# Patient Record
Sex: Female | Born: 1970
Health system: Southern US, Community
[De-identification: ages and names within clinical notes are randomized; demographics above are authoritative.]

## PROBLEM LIST (undated history)

## (undated) DIAGNOSIS — E079 Disorder of thyroid, unspecified: Secondary | ICD-10-CM

## (undated) HISTORY — DX: Disorder of thyroid, unspecified: E07.9

## (undated) HISTORY — PX: APPENDECTOMY: SHX54

## (undated) HISTORY — PX: ABDOMINAL HYSTERECTOMY: SHX81

## (undated) HISTORY — PX: CHOLECYSTECTOMY: SHX55

## (undated) HISTORY — PX: KNEE SURGERY: SHX244

---

## 2016-11-12 ENCOUNTER — Ambulatory Visit (INDEPENDENT_AMBULATORY_CARE_PROVIDER_SITE_OTHER): Payer: Managed Care, Other (non HMO) | Admitting: Emergency Medicine

## 2016-11-12 VITALS — BP 146/86 | HR 67 | Temp 98.0°F | Resp 18 | Ht 66.0 in | Wt 167.0 lb

## 2016-11-12 DIAGNOSIS — N390 Urinary tract infection, site not specified: Secondary | ICD-10-CM | POA: Diagnosis not present

## 2016-11-12 DIAGNOSIS — S39012A Strain of muscle, fascia and tendon of lower back, initial encounter: Secondary | ICD-10-CM | POA: Diagnosis not present

## 2016-11-12 DIAGNOSIS — M62838 Other muscle spasm: Secondary | ICD-10-CM | POA: Diagnosis not present

## 2016-11-12 DIAGNOSIS — M5442 Lumbago with sciatica, left side: Secondary | ICD-10-CM | POA: Insufficient documentation

## 2016-11-12 LAB — COMPREHENSIVE METABOLIC PANEL
A/G RATIO: 2 (ref 1.2–2.2)
ALK PHOS: 51 IU/L (ref 39–117)
ALT: 20 IU/L (ref 0–32)
AST: 13 IU/L (ref 0–40)
Albumin: 4.3 g/dL (ref 3.5–5.5)
BILIRUBIN TOTAL: 0.3 mg/dL (ref 0.0–1.2)
BUN/Creatinine Ratio: 17 (ref 9–23)
BUN: 11 mg/dL (ref 6–24)
CHLORIDE: 105 mmol/L (ref 96–106)
CO2: 23 mmol/L (ref 18–29)
Calcium: 9.3 mg/dL (ref 8.7–10.2)
Creatinine, Ser: 0.65 mg/dL (ref 0.57–1.00)
GFR calc Af Amer: 126 mL/min/{1.73_m2} (ref >59)
GFR calc non Af Amer: 109 mL/min/{1.73_m2} (ref >59)
GLOBULIN, TOTAL: 2.2 g/dL (ref 1.5–4.5)
Glucose: 89 mg/dL (ref 65–99)
POTASSIUM: 4.4 mmol/L (ref 3.5–5.2)
SODIUM: 142 mmol/L (ref 134–144)
Total Protein: 6.5 g/dL (ref 6.0–8.5)

## 2016-11-12 LAB — POCT URINALYSIS DIP (MANUAL ENTRY)
Bilirubin, UA: NEGATIVE
GLUCOSE UA: NEGATIVE
Ketones, POC UA: NEGATIVE
Leukocytes, UA: NEGATIVE
NITRITE UA: POSITIVE — AB
PH UA: 5.5 (ref 5.0–8.0)
Protein Ur, POC: NEGATIVE
RBC UA: NEGATIVE
SPEC GRAV UA: 1.01 (ref 1.030–1.035)
UROBILINOGEN UA: 0.2 (ref ?–2.0)

## 2016-11-12 LAB — CBC WITH DIFFERENTIAL/PLATELET
BASOS: 0 %
Basophils Absolute: 0 10*3/uL (ref 0.0–0.2)
EOS (ABSOLUTE): 0.1 10*3/uL (ref 0.0–0.4)
EOS: 1 %
HEMATOCRIT: 45.1 % (ref 34.0–46.6)
HEMOGLOBIN: 15.1 g/dL (ref 11.1–15.9)
IMMATURE GRANS (ABS): 0 10*3/uL (ref 0.0–0.1)
IMMATURE GRANULOCYTES: 0 %
LYMPHS: 28 %
Lymphocytes Absolute: 2.8 10*3/uL (ref 0.7–3.1)
MCH: 32.2 pg (ref 26.6–33.0)
MCHC: 33.5 g/dL (ref 31.5–35.7)
MCV: 96 fL (ref 79–97)
MONOCYTES: 5 %
Monocytes Absolute: 0.5 10*3/uL (ref 0.1–0.9)
NEUTROS ABS: 6.8 10*3/uL (ref 1.4–7.0)
NEUTROS PCT: 66 %
Platelets: 216 10*3/uL (ref 150–379)
RBC: 4.69 x10E6/uL (ref 3.77–5.28)
RDW: 14.7 % (ref 12.3–15.4)
WBC: 10.3 10*3/uL (ref 3.4–10.8)

## 2016-11-12 MED ORDER — KETOROLAC TROMETHAMINE 60 MG/2ML IM SOLN
60.0000 mg | Freq: Once | INTRAMUSCULAR | Status: AC
  Administered 2016-11-12: 60 mg via INTRAMUSCULAR

## 2016-11-12 MED ORDER — PREDNISONE 20 MG PO TABS: 40.0000 mg | ORAL_TABLET | Freq: Every day | ORAL | 0 refills | Status: AC

## 2016-11-12 MED ORDER — SULFAMETHOXAZOLE-TRIMETHOPRIM 800-160 MG PO TABS: 1.0000 | ORAL_TABLET | Freq: Two times a day (BID) | ORAL | 0 refills | Status: AC

## 2016-11-12 MED ORDER — CYCLOBENZAPRINE HCL 10 MG PO TABS: 10.0000 mg | ORAL_TABLET | Freq: Three times a day (TID) | ORAL | 0 refills | Status: DC | PRN

## 2016-11-12 MED ORDER — HYDROCODONE-ACETAMINOPHEN 5-325 MG PO TABS: 1.0000 | ORAL_TABLET | Freq: Four times a day (QID) | ORAL | 0 refills | Status: DC | PRN

## 2016-11-12 NOTE — Progress Notes (Signed)
Kimberley Essik 46 y.o.   Chief Complaint  Patient presents with  . Back Pain    lower back since Friday evening.    HISTORY OF PRESENT ILLNESS: This is a 46 y.o. female complaining of low back pain that started over the weekend after helping mother in yard.  Back Pain  This is a new problem. The current episode started in the past 7 days. The problem occurs constantly. The problem has been gradually worsening since onset. The pain is present in the lumbar spine. The quality of the pain is described as aching. The pain radiates to the left thigh and left knee. The pain is at a severity of 7/10. The pain is moderate. The pain is the same all the time. The symptoms are aggravated by bending, position, standing and twisting. Pertinent negatives include no abdominal pain, bladder incontinence, bowel incontinence, chest pain, dysuria, fever, leg pain, numbness, paresis, paresthesias, pelvic pain, perianal numbness, tingling or weakness. She has tried NSAIDs and analgesics for the symptoms. The treatment provided no relief.     Prior to Admission medications   Not on File    Allergies  Allergen Reactions  . Penicillins Rash    There are no active problems to display for this patient.   Past Medical History:  Diagnosis Date  . Thyroid disease     Past Surgical History:  Procedure Laterality Date  . ABDOMINAL HYSTERECTOMY    . APPENDECTOMY    . CESAREAN SECTION    . CHOLECYSTECTOMY    . KNEE SURGERY      Social History   Social History  . Marital status: Divorced    Spouse name: N/A  . Number of children: N/A  . Years of education: N/A   Occupational History  . Not on file.   Social History Main Topics  . Smoking status: Current Every Day Smoker    Packs/day: 0.50    Types: Cigarettes  . Smokeless tobacco: Never Used  . Alcohol use Not on file  . Drug use: Unknown  . Sexual activity: Not on file   Other Topics Concern  . Not on file   Social History  Narrative  . No narrative on file    History reviewed. No pertinent family history.   Review of Systems  Constitutional: Negative for chills and fever.  HENT: Negative.   Eyes: Negative.   Respiratory: Negative.  Negative for cough.   Cardiovascular: Negative for chest pain and palpitations.  Gastrointestinal: Negative for abdominal pain, bowel incontinence, diarrhea, nausea and vomiting.  Genitourinary: Positive for flank pain, frequency and urgency. Negative for bladder incontinence, dysuria, hematuria and pelvic pain.  Musculoskeletal: Positive for back pain. Negative for neck pain.  Skin: Negative for rash.  Neurological: Negative for dizziness, tingling, sensory change, focal weakness, weakness, numbness and paresthesias.  Endo/Heme/Allergies: Negative.   All other systems reviewed and are negative.  Vitals:   11/12/16 0927 11/12/16 1003  BP: (!) 158/89 (!) 146/86  Pulse: 67   Resp: 18   Temp: 98 F (36.7 C)      Physical Exam  Constitutional: She is oriented to person, place, and time. She appears well-developed and well-nourished.  HENT:  Head: Normocephalic and atraumatic.  Eyes: Conjunctivae and EOM are normal. Pupils are equal, round, and reactive to light.  Neck: Normal range of motion. Neck supple.  Cardiovascular: Normal rate, regular rhythm and normal heart sounds.   Pulmonary/Chest: Effort normal and breath sounds normal.  Abdominal: Soft. Bowel sounds are  normal. She exhibits no distension. There is no tenderness.  Musculoskeletal:       Lumbar back: She exhibits decreased range of motion, tenderness, pain and spasm. She exhibits no bony tenderness.       Back:  Lymphadenopathy:    She has no cervical adenopathy.  Neurological: She is alert and oriented to person, place, and time. No sensory deficit. She exhibits normal muscle tone.  Skin: Skin is warm and dry. Capillary refill takes less than 2 seconds.  Psychiatric: She has a normal mood and affect.  Her behavior is normal.  Vitals reviewed.  Results for orders placed or performed in visit on 11/12/16 (from the past 24 hour(s))  POCT urinalysis dipstick     Status: Abnormal   Collection Time: 11/12/16 10:11 AM  Result Value Ref Range   Color, UA yellow yellow   Clarity, UA cloudy (A) clear   Glucose, UA negative negative   Bilirubin, UA negative negative   Ketones, POC UA negative negative   Spec Grav, UA 1.010 1.030 - 1.035   Blood, UA negative negative   pH, UA 5.5 5.0 - 8.0   Protein Ur, POC negative negative   Urobilinogen, UA 0.2 Negative - 2.0   Nitrite, UA Positive (A) Negative   Leukocytes, UA Negative Negative     ASSESSMENT & PLAN:  Macon Large was seen today for back pain.  Diagnoses and all orders for this visit:  Acute left-sided low back pain with left-sided sciatica -     CBC with Differential/Platelet -     Comprehensive metabolic panel -     POCT urinalysis dipstick -     ketorolac (TORADOL) injection 60 mg; Inject 2 mLs (60 mg total) into the muscle once.  Strain of lumbar region, initial encounter  Muscle spasm  Acute UTI (urinary tract infection) -     Urine culture  Other orders -     Cancel: POCT Microscopic Urinalysis ([facility]) -     Cancel: Urine culture -     HYDROcodone-acetaminophen (NORCO) 5-325 MG tablet; Take 1 tablet by mouth every 6 (six) hours as needed for moderate pain. -     predniSONE (DELTASONE) 20 MG tablet; Take 2 tablets (40 mg total) by mouth daily with breakfast. -     cyclobenzaprine (FLEXERIL) 10 MG tablet; Take 1 tablet (10 mg total) by mouth 3 (three) times daily as needed for muscle spasms. -     sulfamethoxazole-trimethoprim (BACTRIM DS,SEPTRA DS) 800-160 MG tablet; Take 1 tablet by mouth 2 (two) times daily.    Patient Instructions  Urinary Tract Infection, Adult A urinary tract infection (UTI) is an infection of any part of the urinary tract. The urinary tract includes  the:  Kidneys.  Ureters.  Bladder.  Urethra. These organs make, store, and get rid of pee (urine) in the body. Follow these instructions at home:  Take over-the-counter and prescription medicines only as told by your doctor.  If you were prescribed an antibiotic medicine, take it as told by your doctor. Do not stop taking the antibiotic even if you start to feel better.  Avoid the following drinks:  Alcohol.  Caffeine.  Tea.  Carbonated drinks.  Drink enough fluid to keep your pee clear or pale yellow.  Keep all follow-up visits as told by your doctor. This is important.  Make sure to:  Empty your bladder often and completely. Do not to hold pee for long periods of time.  Empty your bladder before and after  sex.  Wipe from front to back after a bowel movement if you are female. Use each tissue one time when you wipe. Contact a doctor if:  You have back pain.  You have a fever.  You feel sick to your stomach (nauseous).  You throw up (vomit).  Your symptoms do not get better after 3 days.  Your symptoms go away and then come back. Get help right away if:  You have very bad back pain.  You have very bad lower belly (abdominal) pain.  You are throwing up and cannot keep down any medicines or water. This information is not intended to replace advice given to you by your health care provider. Make sure you discuss any questions you have with your health care provider. Document Released: 01/09/2008 Document Revised: 12/29/2015 Document Reviewed: 06/13/2015 Elsevier Interactive Patient Education  2017 [employer] Low Back Sprain A sprain is a stretch or tear in the bands of tissue that hold bones and joints together (ligaments). Sprains of the lower back (lumbar spine) are a common cause of low back pain. A sprain occurs when ligaments are overextended or stretched beyond their limits. The ligaments can become inflamed, resulting in pain and sudden muscle  tightening (spasms). A sprain can be caused by an injury (trauma), or it can develop gradually due to overuse. There are three types of sprains:  Grade 1 is a mild sprain involving an overstretched ligament or a very slight tear of the ligament.  Grade 2 is a moderate sprain involving a partial tear of the ligament.  Grade 3 is a severe sprain involving a complete tear of the ligament. What are the causes? This condition may be caused by:  Trauma, such as a fall or a hit to the body.  Twisting or overstretching the back. This may result from doing activities that require a lot of energy, such as lifting heavy objects. What increases the risk? The following factors may increase your risk of getting this condition:  Playing contact sports.  Participating in sports or activities that put excessive stress on the back and require a lot of bending and twisting, including:  Lifting weights or heavy objects.  Gymnastics.  Soccer.  Figure skating.  Snowboarding.  Being overweight or obese.  Having poor strength and flexibility. What are the signs or symptoms? Symptoms of this condition may include:  Sharp or dull pain in the lower back that does not go away. Pain may extend to the buttocks.  Stiffness.  Limited range of motion.  Inability to stand up straight due to stiffness or pain.  Muscle spasms. How is this diagnosed?   This condition may be diagnosed based on:  Your symptoms.  Your medical history.  A physical exam.  Your health care provider may push on certain areas of your back to determine the source of your pain.  You may be asked to bend forward, backward, and side to side to assess the severity of your pain and your range of motion.  Imaging tests, such as:  X-rays.  MRI. How is this treated? Treatment for this condition may include:  Applying heat and cold to the affected area.  Medicines to help relieve pain and to relax your muscles (muscle  relaxants).  NSAIDs to help reduce swelling and discomfort.  Physical therapy. When your symptoms improve, it is important to gradually return to your normal routine as soon as possible to reduce pain, avoid stiffness, and avoid loss of muscle strength. Generally, symptoms  should improve within 6 weeks of treatment. However, recovery time varies. Follow these instructions at home: Managing pain, stiffness, and swelling   If directed, apply ice to the injured area during the first 24 hours after your injury.  Put ice in a plastic bag.  Place a towel between your skin and the bag.  Leave the ice on for 20 minutes, 2-3 times a day.  If directed, apply heat to the affected area as often as told by your health care provider. Use the heat source that your health care provider recommends, such as a moist heat pack or a heating pad.  Place a towel between your skin and the heat source.  Leave the heat on for 20-30 minutes.  Remove the heat if your skin turns bright red. This is especially important if you are unable to feel pain, heat, or cold. You may have a greater risk of getting burned. Activity   Rest and return to your normal activities as told by your health care provider. Ask your health care provider what activities are safe for you.  Avoid activities that take a lot of effort (are strenuous) for as long as told by your health care provider.  Do exercises as told by your health care provider. General instructions    Take over-the-counter and prescription medicines only as told by your health care provider.  If you have questions or concerns about safety while taking pain medicine, talk with your health care provider.  Do not drive or operate heavy machinery until you know how your pain medicine affects you.  Do not use any tobacco products, such as cigarettes, chewing tobacco, and e-cigarettes. Tobacco can delay bone healing. If you need help quitting, ask your health care  provider.  Keep all follow-up visits as told by your health care provider. This is important. How is this prevented?  Warm up and stretch before being active.  Cool down and stretch after being active.  Give your body time to rest between periods of activity.  Avoid:  Being physically inactive for long periods at a time.  Exercising or playing sports when you are tired or in pain.  Use correct form when playing sports and lifting heavy objects.  Use good posture when sitting and standing.  Maintain a healthy weight.  Sleep on a mattress with medium firmness to support your back.  Make sure to use equipment that fits you, including shoes that fit well.  Be safe and responsible while being active to avoid falls.  Do at least 150 minutes of moderate-intensity exercise each week, such as brisk walking or water aerobics. Try a form of exercise that takes stress off your back, such as swimming or stationary cycling.  Maintain physical fitness, including:  Strength. In particular, develop and maintain strong abdominal muscles.  Flexibility.  Cardiovascular fitness.  Endurance. Contact a health care provider if:  Your back pain does not improve after 6 weeks of treatment.  Your symptoms get worse. Get help right away if:  Your back pain is severe.  You are unable to stand or walk.  You develop pain in your legs.  You develop weakness in your buttocks or legs.  You have difficulty controlling when you urinate or when you have a bowel movement. This information is not intended to replace advice given to you by your health care provider. Make sure you discuss any questions you have with your health care provider. Document Released: 07/23/2005 Document Revised: 03/29/2016 Document Reviewed: 05/04/2015  [employer] Patient Education  2017 [employer].    Edwina Barth, MD [facility]

## 2016-11-12 NOTE — Patient Instructions (Signed)
Urinary Tract Infection, Adult A urinary tract infection (UTI) is an infection of any part of the urinary tract. The urinary tract includes the:  Kidneys.  Ureters.  Bladder.  Urethra. These organs make, store, and get rid of pee (urine) in the body. Follow these instructions at home:  Take over-the-counter and prescription medicines only as told by your doctor.  If you were prescribed an antibiotic medicine, take it as told by your doctor. Do not stop taking the antibiotic even if you start to feel better.  Avoid the following drinks:  Alcohol.  Caffeine.  Tea.  Carbonated drinks.  Drink enough fluid to keep your pee clear or pale yellow.  Keep all follow-up visits as told by your doctor. This is important.  Make sure to:  Empty your bladder often and completely. Do not to hold pee for long periods of time.  Empty your bladder before and after sex.  Wipe from front to back after a bowel movement if you are female. Use each tissue one time when you wipe. Contact a doctor if:  You have back pain.  You have a fever.  You feel sick to your stomach (nauseous).  You throw up (vomit).  Your symptoms do not get better after 3 days.  Your symptoms go away and then come back. Get help right away if:  You have very bad back pain.  You have very bad lower belly (abdominal) pain.  You are throwing up and cannot keep down any medicines or water. This information is not intended to replace advice given to you by your health care provider. Make sure you discuss any questions you have with your health care provider. Document Released: 01/09/2008 Document Revised: 12/29/2015 Document Reviewed: 06/13/2015 Elsevier Interactive Patient Education  2017 [employer]. Low Back Sprain A sprain is a stretch or tear in the bands of tissue that hold bones and joints together (ligaments). Sprains of the lower back (lumbar spine) are a common cause of low back pain. A sprain occurs  when ligaments are overextended or stretched beyond their limits. The ligaments can become inflamed, resulting in pain and sudden muscle tightening (spasms). A sprain can be caused by an injury (trauma), or it can develop gradually due to overuse. There are three types of sprains:  Grade 1 is a mild sprain involving an overstretched ligament or a very slight tear of the ligament.  Grade 2 is a moderate sprain involving a partial tear of the ligament.  Grade 3 is a severe sprain involving a complete tear of the ligament. What are the causes? This condition may be caused by:  Trauma, such as a fall or a hit to the body.  Twisting or overstretching the back. This may result from doing activities that require a lot of energy, such as lifting heavy objects. What increases the risk? The following factors may increase your risk of getting this condition:  Playing contact sports.  Participating in sports or activities that put excessive stress on the back and require a lot of bending and twisting, including:  Lifting weights or heavy objects.  Gymnastics.  Soccer.  Figure skating.  Snowboarding.  Being overweight or obese.  Having poor strength and flexibility. What are the signs or symptoms? Symptoms of this condition may include:  Sharp or dull pain in the lower back that does not go away. Pain may extend to the buttocks.  Stiffness.  Limited range of motion.  Inability to stand up straight due to stiffness or  pain.  Muscle spasms. How is this diagnosed?   This condition may be diagnosed based on:  Your symptoms.  Your medical history.  A physical exam.  Your health care provider may push on certain areas of your back to determine the source of your pain.  You may be asked to bend forward, backward, and side to side to assess the severity of your pain and your range of motion.  Imaging tests, such as:  X-rays.  MRI. How is this treated? Treatment for this  condition may include:  Applying heat and cold to the affected area.  Medicines to help relieve pain and to relax your muscles (muscle relaxants).  NSAIDs to help reduce swelling and discomfort.  Physical therapy. When your symptoms improve, it is important to gradually return to your normal routine as soon as possible to reduce pain, avoid stiffness, and avoid loss of muscle strength. Generally, symptoms should improve within 6 weeks of treatment. However, recovery time varies. Follow these instructions at home: Managing pain, stiffness, and swelling   If directed, apply ice to the injured area during the first 24 hours after your injury.  Put ice in a plastic bag.  Place a towel between your skin and the bag.  Leave the ice on for 20 minutes, 2-3 times a day.  If directed, apply heat to the affected area as often as told by your health care provider. Use the heat source that your health care provider recommends, such as a moist heat pack or a heating pad.  Place a towel between your skin and the heat source.  Leave the heat on for 20-30 minutes.  Remove the heat if your skin turns bright red. This is especially important if you are unable to feel pain, heat, or cold. You may have a greater risk of getting burned. Activity   Rest and return to your normal activities as told by your health care provider. Ask your health care provider what activities are safe for you.  Avoid activities that take a lot of effort (are strenuous) for as long as told by your health care provider.  Do exercises as told by your health care provider. General instructions    Take over-the-counter and prescription medicines only as told by your health care provider.  If you have questions or concerns about safety while taking pain medicine, talk with your health care provider.  Do not drive or operate heavy machinery until you know how your pain medicine affects you.  Do not use any tobacco  products, such as cigarettes, chewing tobacco, and e-cigarettes. Tobacco can delay bone healing. If you need help quitting, ask your health care provider.  Keep all follow-up visits as told by your health care provider. This is important. How is this prevented?  Warm up and stretch before being active.  Cool down and stretch after being active.  Give your body time to rest between periods of activity.  Avoid:  Being physically inactive for long periods at a time.  Exercising or playing sports when you are tired or in pain.  Use correct form when playing sports and lifting heavy objects.  Use good posture when sitting and standing.  Maintain a healthy weight.  Sleep on a mattress with medium firmness to support your back.  Make sure to use equipment that fits you, including shoes that fit well.  Be safe and responsible while being active to avoid falls.  Do at least 150 minutes of moderate-intensity exercise each week,  such as brisk walking or water aerobics. Try a form of exercise that takes stress off your back, such as swimming or stationary cycling.  Maintain physical fitness, including:  Strength. In particular, develop and maintain strong abdominal muscles.  Flexibility.  Cardiovascular fitness.  Endurance. Contact a health care provider if:  Your back pain does not improve after 6 weeks of treatment.  Your symptoms get worse. Get help right away if:  Your back pain is severe.  You are unable to stand or walk.  You develop pain in your legs.  You develop weakness in your buttocks or legs.  You have difficulty controlling when you urinate or when you have a bowel movement. This information is not intended to replace advice given to you by your health care provider. Make sure you discuss any questions you have with your health care provider. Document Released: 07/23/2005 Document Revised: 03/29/2016 Document Reviewed: 05/04/2015 Elsevier Interactive  Patient Education  2017 [employer].

## 2016-11-16 ENCOUNTER — Ambulatory Visit (HOSPITAL_COMMUNITY)
Admission: EM | Admit: 2016-11-16 | Discharge: 2016-11-16 | Disposition: A | Discharge status: Home / Self Care | Payer: Managed Care, Other (non HMO) | Attending: Family Medicine | Admitting: Family Medicine

## 2016-11-16 ENCOUNTER — Encounter (HOSPITAL_COMMUNITY): Payer: Self-pay | Admitting: Family Medicine

## 2016-11-16 DIAGNOSIS — M545 Low back pain: Secondary | ICD-10-CM

## 2016-11-16 DIAGNOSIS — M62830 Muscle spasm of back: Secondary | ICD-10-CM | POA: Diagnosis not present

## 2016-11-16 LAB — POCT URINALYSIS DIP (DEVICE)
BILIRUBIN URINE: NEGATIVE
GLUCOSE, UA: NEGATIVE mg/dL
Hgb urine dipstick: NEGATIVE
KETONES UR: NEGATIVE mg/dL
LEUKOCYTES UA: NEGATIVE
NITRITE: NEGATIVE
PH: 7 (ref 5.0–8.0)
Protein, ur: NEGATIVE mg/dL
Specific Gravity, Urine: 1.01 (ref 1.005–1.030)
Urobilinogen, UA: 0.2 mg/dL (ref 0.0–1.0)

## 2016-11-16 LAB — URINE CULTURE

## 2016-11-16 MED ORDER — HYDROCODONE-ACETAMINOPHEN 5-325 MG PO TABS: 1.0000 | ORAL_TABLET | Freq: Four times a day (QID) | ORAL | 0 refills | Status: DC | PRN

## 2016-11-16 NOTE — ED Provider Notes (Signed)
[facility]    CSN: [id number] Arrival date & time: 11/16/16  1410     History   Chief Complaint Chief Complaint  Patient presents with  . Back Pain    HPI Alejandra Jackson is a 46 y.o. female presenting for left lower back pain.   She reports gradual onset of moderate-severe aching pain in the left lower back at times radiating to the left buttock/thigh worse with movements and sitting. She sits 8 hours a day at work which has made symptoms worse despite taking a muscle relaxer, hydrocodone, and prednisone prescribed at the onset of symptoms. She has also been taking bactrim for UTI and reports recently increased urinary frequency and dysuria. She has never had a fever, abd pain, N/V/D, chest pain, dyspnea, myalgias, arthralgias. She has no history of back surgery. There has been no urinary incontinence or bowel incontinence, unintentional weight loss, night time awakenings secondary to pain, weakness in one or both legs  HPI  Past Medical History:  Diagnosis Date  . Thyroid disease     Patient Active Problem List   Diagnosis Date Noted  . Acute left-sided low back pain with left-sided sciatica 11/12/2016  . Strain of lumbar region 11/12/2016  . Muscle spasm 11/12/2016  . Acute UTI (urinary tract infection) 11/12/2016    Past Surgical History:  Procedure Laterality Date  . ABDOMINAL HYSTERECTOMY    . APPENDECTOMY    . CESAREAN SECTION    . CHOLECYSTECTOMY    . KNEE SURGERY      OB History    No data available       Home Medications    Prior to Admission medications   Medication Sig Start Date End Date Taking? Authorizing Provider  cyclobenzaprine (FLEXERIL) 10 MG tablet Take 1 tablet (10 mg total) by mouth 3 (three) times daily as needed for muscle spasms. 11/12/16   Miguel Victorino December, MD  HYDROcodone-acetaminophen (NORCO) 5-325 MG tablet Take 1 tablet by mouth every 6 (six) hours as needed for moderate pain. 11/16/16   Tyrone Nine, MD  predniSONE  (DELTASONE) 20 MG tablet Take 2 tablets (40 mg total) by mouth daily with breakfast. 11/12/16 11/17/16  Georgina Quint, MD  sulfamethoxazole-trimethoprim (BACTRIM DS,SEPTRA DS) 800-160 MG tablet Take 1 tablet by mouth 2 (two) times daily. 11/12/16 11/19/16  Georgina Quint, MD    Family History History reviewed. No pertinent family history.  Social History Social History  Substance Use Topics  . Smoking status: Current Every Day Smoker    Packs/day: 0.50    Types: Cigarettes  . Smokeless tobacco: Never Used  . Alcohol use Not on file     Allergies   Penicillins   Review of Systems Review of Systems   Physical Exam Triage Vital Signs ED Triage Vitals [11/16/16 1436]  Enc Vitals Group     BP (!) 135/58     Pulse Rate 95     Resp 20     Temp 98.6 F (37 C)     Temp src      SpO2 98 %     Weight      Height      Head Circumference      Peak Flow      Pain Score 9     Pain Loc      Pain Edu?      Excl. in GC?    No data found.   Updated Vital Signs BP (!) 135/58  Pulse 95   Temp 98.6 F (37 C)   Resp 20   SpO2 98%   Physical Exam  Constitutional: She appears well-developed and well-nourished. No distress.  HENT:  Head: Normocephalic and atraumatic.  Eyes: Conjunctivae are normal.  Neck: Neck supple.  Cardiovascular: Normal rate and regular rhythm.   No murmur heard. Pulmonary/Chest: Effort normal and breath sounds normal. No respiratory distress.  Abdominal: Soft. There is no tenderness.  Musculoskeletal: She exhibits no edema.  Back:  Normal skin. Spine with normal alignment and no deformity. No tenderness to vertebral process palpation. Paraspinous muscles are tender and spastic. Range of motion is full at neck and limited by pain in lumbar sacral regions. Straight leg raise is negative Neuro:  Sensation and motor function 5/5 bilateral lower extremities. Patellar and achilles DTR's 2+   Neurological: She is alert.  Skin: Skin is warm and  dry.  Psychiatric: She has a normal mood and affect.  Nursing note and vitals reviewed.    UC Treatments / Results  Labs (all labs ordered are listed, but only abnormal results are displayed) Labs Reviewed  POCT URINALYSIS DIP (DEVICE)    EKG  EKG Interpretation None       Radiology No results found.  Procedures Procedures (including critical care time)  Medications Ordered in UC Medications - No data to display   Initial Impression / Assessment and Plan / UC Course  I have reviewed the triage vital signs and the nursing notes.  Pertinent labs & imaging results that were available during my care of the patient were reviewed by me and considered in my medical decision making (see chart for details).  Final Clinical Impressions(s) / UC Diagnoses   Final diagnoses:  Lumbar paraspinal muscle spasm   46 y.o. female with lumbar paraspinal muscle spasm with pain improved with symptomatic therapies. No red flags on history or exam to warrant imaging at this time (< 6 weeks duration, no trauma). She's completed prednisone and having trouble sleeping, so will not continue this.  - Continue anti-spasmodic and will refill hydrocodone #15 - Heating pad, rest, massage - Return precautions including red flags and duration > 6 weeks discussed - Note for work today provided - Urine checked today for complaints of dysuria/urinary frequency. UA negative.   New Prescriptions Current Discharge Medication List       Tyrone Nine, MD 11/16/16 1525

## 2016-11-16 NOTE — ED Triage Notes (Signed)
Pt here for left lower back pain radiating into leg. sts recently seen and diagnosed with UTI and back pain. Given abx which she is currently still taking and pain meds which she is out of. sts dysuria which she didn't have before.

## 2016-11-22 ENCOUNTER — Ambulatory Visit (INDEPENDENT_AMBULATORY_CARE_PROVIDER_SITE_OTHER): Payer: Managed Care, Other (non HMO) | Admitting: Emergency Medicine

## 2016-11-22 VITALS — BP 146/88 | HR 70 | Temp 98.1°F | Resp 16 | Ht 66.0 in | Wt 164.0 lb

## 2016-11-22 DIAGNOSIS — M5442 Lumbago with sciatica, left side: Secondary | ICD-10-CM

## 2016-11-22 MED ORDER — HYDROCODONE-ACETAMINOPHEN 5-325 MG PO TABS: 1.0000 | ORAL_TABLET | Freq: Four times a day (QID) | ORAL | 0 refills | Status: DC | PRN

## 2016-11-22 MED ORDER — CYCLOBENZAPRINE HCL 10 MG PO TABS: 10.0000 mg | ORAL_TABLET | Freq: Three times a day (TID) | ORAL | 0 refills | Status: DC | PRN

## 2016-11-22 NOTE — Patient Instructions (Addendum)
   IF you received an x-ray today, you will receive an invoice from [facility]. Please contact [facility] at [phone] with questions or concerns regarding your invoice.   IF you received labwork today, you will receive an invoice from [facility]. Please contact [facility] at [phone] with questions or concerns regarding your invoice.   Our billing staff will not be able to assist you with questions regarding bills from these companies.  You will be contacted with the lab results as soon as they are available. The fastest way to get your results is to activate your My Chart account. Instructions are located on the last page of this paperwork. If you have not heard from us regarding the results in 2 weeks, please contact this office.     Sciatica Sciatica is pain, numbness, weakness, or tingling along your sciatic nerve. The sciatic nerve starts in the lower back and goes down the back of each leg. Sciatica happens when this nerve is pinched or has pressure put on it. Sciatica usually goes away on its own or with treatment. Sometimes, sciatica may keep coming back (recur). Follow these instructions at home: Medicines   Take over-the-counter and prescription medicines only as told by your doctor.  Do not drive or use heavy machinery while taking prescription pain medicine. Managing pain   If directed, put ice on the affected area.  Put ice in a plastic bag.  Place a towel between your skin and the bag.  Leave the ice on for 20 minutes, 2-3 times a day.  After icing, apply heat to the affected area before you exercise or as often as told by your doctor. Use the heat source that your doctor tells you to use, such as a moist heat pack or a heating pad.  Place a towel between your skin and the heat source.  Leave the heat on for 20-30 minutes.  Remove the heat if your skin turns bright red. This is especially important if you are unable to feel pain, heat, or  cold. You may have a greater risk of getting burned. Activity   Return to your normal activities as told by your doctor. Ask your doctor what activities are safe for you.  Avoid activities that make your sciatica worse.  Take short rests during the day. Rest in a lying or standing position. This is usually better than sitting to rest.  When you rest for a long time, do some physical activity or stretching between periods of rest.  Avoid sitting for a long time without moving. Get up and move around at least one time each hour.  Exercise and stretch regularly, as told by your doctor.  Do not lift anything that is heavier than 10 lb (4.5 kg) while you have symptoms of sciatica.  Avoid lifting heavy things even when you do not have symptoms.  Avoid lifting heavy things over and over.  When you lift objects, always lift in a way that is safe for your body. To do this, you should:  Bend your knees.  Keep the object close to your body.  Avoid twisting. General instructions   Use good posture.  Avoid leaning forward when you are sitting.  Avoid hunching over when you are standing.  Stay at a healthy weight.  Wear comfortable shoes that support your feet. Avoid wearing high heels.  Avoid sleeping on a mattress that is too soft or too hard. You might have less pain if you sleep on a mattress   that is firm enough to support your back.  Keep all follow-up visits as told by your doctor. This is important. Contact a doctor if:  You have pain that:  Wakes you up when you are sleeping.  Gets worse when you lie down.  Is worse than the pain you have had in the past.  Lasts longer than 4 weeks.  You lose weight for without trying. Get help right away if:  You cannot control when you pee (urinate) or poop (have a bowel movement).  You have weakness in any of these areas and it gets worse.  Lower back.  Lower belly (pelvis).  Butt (buttocks).  Legs.  You have redness  or swelling of your back.  You have a burning feeling when you pee. This information is not intended to replace advice given to you by your health care provider. Make sure you discuss any questions you have with your health care provider. Document Released: 05/01/2008 Document Revised: 12/29/2015 Document Reviewed: 04/01/2015 Elsevier Interactive Patient Education  2017 Elsevier Inc.  

## 2016-11-22 NOTE — Progress Notes (Signed)
Alejandra Jackson 46 y.o.   Chief Complaint  Patient presents with  . Follow-up    Back pain, radiating down left leg, numbness/tingling    HISTORY OF PRESENT ILLNESS: This is a 46 y.o. female complaining of left sided low back pain radiating to back of left leg x 1 -2 weeks. Denies trauma or any other significant symptoms.  HPI   Prior to Admission medications   Medication Sig Start Date End Date Taking? Authorizing Provider  cyclobenzaprine (FLEXERIL) 10 MG tablet Take 1 tablet (10 mg total) by mouth 3 (three) times daily as needed for muscle spasms. 11/22/16  Yes Abbe Bula Victorino December, MD  HYDROcodone-acetaminophen (NORCO) 5-325 MG tablet Take 1 tablet by mouth every 6 (six) hours as needed for moderate pain. 11/22/16  Yes Christyanna Mckeon Victorino December, MD    Allergies  Allergen Reactions  . Penicillins Rash    Patient Active Problem List   Diagnosis Date Noted  . Acute left-sided low back pain with left-sided sciatica 11/12/2016  . Strain of lumbar region 11/12/2016  . Muscle spasm 11/12/2016  . Acute UTI (urinary tract infection) 11/12/2016    Past Medical History:  Diagnosis Date  . Thyroid disease     Past Surgical History:  Procedure Laterality Date  . ABDOMINAL HYSTERECTOMY    . APPENDECTOMY    . CESAREAN SECTION    . CHOLECYSTECTOMY    . KNEE SURGERY      Social History   Social History  . Marital status: Divorced    Spouse name: N/A  . Number of children: N/A  . Years of education: N/A   Occupational History  . Not on file.   Social History Main Topics  . Smoking status: Current Every Day Smoker    Packs/day: 0.50    Types: Cigarettes  . Smokeless tobacco: Never Used  . Alcohol use Not on file  . Drug use: Unknown  . Sexual activity: Not on file   Other Topics Concern  . Not on file   Social History Narrative  . No narrative on file    No family history on file.   Review of Systems  Constitutional: Negative.  Negative for chills and fever.    HENT: Negative.   Eyes: Negative.   Respiratory: Negative for cough and shortness of breath.   Cardiovascular: Negative for chest pain, claudication and leg swelling.  Gastrointestinal: Negative for abdominal pain, diarrhea, nausea and vomiting.  Genitourinary: Negative for dysuria and hematuria.  Musculoskeletal: Positive for back pain.  Skin: Negative.  Negative for rash.  Neurological: Negative for dizziness, sensory change, speech change, focal weakness and headaches.  Endo/Heme/Allergies: Negative.   All other systems reviewed and are negative.   Vitals:   11/22/16 1752  BP: (!) 146/88  Pulse: 70  Resp: 16  Temp: 98.1 F (36.7 C)    Physical Exam  Constitutional: She is oriented to person, place, and time. She appears well-developed and well-nourished.  HENT:  Head: Normocephalic and atraumatic.  Nose: Nose normal.  Mouth/Throat: Oropharynx is clear and moist. No oropharyngeal exudate.  Eyes: Conjunctivae and EOM are normal. Pupils are equal, round, and reactive to light.  Neck: Normal range of motion. Neck supple. No JVD present. No thyromegaly present.  Cardiovascular: Normal rate, regular rhythm, normal heart sounds and intact distal pulses.   Pulmonary/Chest: Effort normal and breath sounds normal.  Abdominal: Soft. Bowel sounds are normal. She exhibits no distension. There is no tenderness.  Musculoskeletal:  Lumbar back: She exhibits decreased range of motion, tenderness, pain and spasm. She exhibits no bony tenderness and no swelling.  Lymphadenopathy:    She has no cervical adenopathy.  Neurological: She is alert and oriented to person, place, and time. She displays normal reflexes. No sensory deficit. She exhibits normal muscle tone.  Skin: Skin is warm and dry. Capillary refill takes less than 2 seconds.  Psychiatric: She has a normal mood and affect. Her behavior is normal.  Vitals reviewed.    ASSESSMENT & PLAN: Alejandra Jackson was seen today for  follow-up.  Diagnoses and all orders for this visit:  Acute left-sided low back pain with left-sided sciatica -     Ambulatory referral to Orthopedic Surgery -     MR LUMBAR SPINE WO CONTRAST; Future  Other orders -     HYDROcodone-acetaminophen (NORCO) 5-325 MG tablet; Take 1 tablet by mouth every 6 (six) hours as needed for moderate pain. -     cyclobenzaprine (FLEXERIL) 10 MG tablet; Take 1 tablet (10 mg total) by mouth 3 (three) times daily as needed for muscle spasms.    Patient Instructions       IF you received an x-ray today, you will receive an invoice from [facility]. Please contact [facility] at [phone] with questions or concerns regarding your invoice.   IF you received labwork today, you will receive an invoice from [facility]. Please contact [facility] at [phone] with questions or concerns regarding your invoice.   Our billing staff will not be able to assist you with questions regarding bills from these companies.  You will be contacted with the lab results as soon as they are available. The fastest way to get your results is to activate your My Chart account. Instructions are located on the last page of this paperwork. If you have not heard from [place] regarding the results in 2 weeks, please contact this office.     Sciatica Sciatica is pain, numbness, weakness, or tingling along your sciatic nerve. The sciatic nerve starts in the lower back and goes down the back of each leg. Sciatica happens when this nerve is pinched or has pressure put on it. Sciatica usually goes away on its own or with treatment. Sometimes, sciatica may keep coming back (recur). Follow these instructions at home: Medicines   Take over-the-counter and prescription medicines only as told by your doctor.  Do not drive or use heavy machinery while taking prescription pain medicine. Managing pain   If directed, put ice on the affected area.  Put ice in a plastic  bag.  Place a towel between your skin and the bag.  Leave the ice on for 20 minutes, 2-3 times a day.  After icing, apply heat to the affected area before you exercise or as often as told by your doctor. Use the heat source that your doctor tells you to use, such as a moist heat pack or a heating pad.  Place a towel between your skin and the heat source.  Leave the heat on for 20-30 minutes.  Remove the heat if your skin turns bright red. This is especially important if you are unable to feel pain, heat, or cold. You may have a greater risk of getting burned. Activity   Return to your normal activities as told by your doctor. Ask your doctor what activities are safe for you.  Avoid activities that make your sciatica worse.  Take short rests during the day. Rest in a lying or standing  position. This is usually better than sitting to rest.  When you rest for a long time, do some physical activity or stretching between periods of rest.  Avoid sitting for a long time without moving. Get up and move around at least one time each hour.  Exercise and stretch regularly, as told by your doctor.  Do not lift anything that is heavier than 10 lb (4.5 kg) while you have symptoms of sciatica.  Avoid lifting heavy things even when you do not have symptoms.  Avoid lifting heavy things over and over.  When you lift objects, always lift in a way that is safe for your body. To do this, you should:  Bend your knees.  Keep the object close to your body.  Avoid twisting. General instructions   Use good posture.  Avoid leaning forward when you are sitting.  Avoid hunching over when you are standing.  Stay at a healthy weight.  Wear comfortable shoes that support your feet. Avoid wearing high heels.  Avoid sleeping on a mattress that is too soft or too hard. You might have less pain if you sleep on a mattress that is firm enough to support your back.  Keep all follow-up visits as told  by your doctor. This is important. Contact a doctor if:  You have pain that:  Wakes you up when you are sleeping.  Gets worse when you lie down.  Is worse than the pain you have had in the past.  Lasts longer than 4 weeks.  You lose weight for without trying. Get help right away if:  You cannot control when you pee (urinate) or poop (have a bowel movement).  You have weakness in any of these areas and it gets worse.  Lower back.  Lower belly (pelvis).  Butt (buttocks).  Legs.  You have redness or swelling of your back.  You have a burning feeling when you pee. This information is not intended to replace advice given to you by your health care provider. Make sure you discuss any questions you have with your health care provider. Document Released: 05/01/2008 Document Revised: 12/29/2015 Document Reviewed: 04/01/2015 Elsevier Interactive Patient Education  2017 Elsevier Inc.      Edwina Barth, MD [facility]

## 2016-11-26 ENCOUNTER — Other Ambulatory Visit: Payer: Self-pay | Admitting: Emergency Medicine

## 2016-11-26 MED ORDER — HYDROCODONE-ACETAMINOPHEN 5-325 MG PO TABS: 1.0000 | ORAL_TABLET | Freq: Four times a day (QID) | ORAL | 0 refills | Status: DC | PRN

## 2016-11-26 NOTE — Telephone Encounter (Signed)
Will refill one last time. She needs Ortho evaluation, requested last time.

## 2016-11-26 NOTE — Telephone Encounter (Signed)
Pt advised rx up front 

## 2016-11-26 NOTE — Telephone Encounter (Signed)
Pt is needing a refill on HYDROcodone-acetaminophen (NORCO) 5-325 MG tablet [[id number]]   Please advise: [phone]

## 2016-11-26 NOTE — Telephone Encounter (Signed)
Refill one last time. Needs Ortho evaluation.

## 2016-11-26 NOTE — Telephone Encounter (Signed)
11/22/16 last refill

## 2016-11-26 NOTE — Telephone Encounter (Signed)
I gave patient Alejandra Jackson's message and told her to come pick up med.  Transferred her to referrals so she could check on the status of it.

## 2017-01-16 ENCOUNTER — Ambulatory Visit (HOSPITAL_COMMUNITY)
Admission: EM | Admit: 2017-01-16 | Discharge: 2017-01-16 | Disposition: A | Discharge status: Home / Self Care | Payer: Managed Care, Other (non HMO) | Attending: Family Medicine | Admitting: Family Medicine

## 2017-01-16 ENCOUNTER — Encounter (HOSPITAL_COMMUNITY): Payer: Self-pay | Admitting: Emergency Medicine

## 2017-01-16 DIAGNOSIS — M5432 Sciatica, left side: Secondary | ICD-10-CM | POA: Diagnosis not present

## 2017-01-16 MED ORDER — PREDNISONE 10 MG (21) PO TBPK: ORAL_TABLET | Freq: Every day | ORAL | 0 refills | Status: DC

## 2017-01-16 MED ORDER — DEXAMETHASONE SODIUM PHOSPHATE 10 MG/ML IJ SOLN
INTRAMUSCULAR | Status: AC
  Filled 2017-01-16: qty 1

## 2017-01-16 MED ORDER — KETOROLAC TROMETHAMINE 60 MG/2ML IM SOLN
INTRAMUSCULAR | Status: AC
  Filled 2017-01-16: qty 2

## 2017-01-16 MED ORDER — HYDROCODONE-ACETAMINOPHEN 5-325 MG PO TABS: 1.0000 | ORAL_TABLET | Freq: Four times a day (QID) | ORAL | 0 refills | Status: DC | PRN

## 2017-01-16 MED ORDER — KETOROLAC TROMETHAMINE 60 MG/2ML IM SOLN
60.0000 mg | Freq: Once | INTRAMUSCULAR | Status: AC
  Administered 2017-01-16: 60 mg via INTRAMUSCULAR

## 2017-01-16 MED ORDER — DEXAMETHASONE SODIUM PHOSPHATE 10 MG/ML IJ SOLN
10.0000 mg | Freq: Once | INTRAMUSCULAR | Status: AC
  Administered 2017-01-16: 10 mg via INTRAMUSCULAR

## 2017-01-16 NOTE — Discharge Instructions (Signed)
For your sciatica pain, you've received 2 injections here in clinic, and I have started you on a course of prednisone, take 6 tablets by mouth daily, then reduce by 1 tablet every other day until finished. I have also prescribed a medicine for pain called hydrocodone, this medicine is a narcotic, it will cause drowsiness, and it is addictive. Do not take more than what is necessary, do not drink alcohol while taking, and do not operate any heavy machinery while taking this medicine. I have attached the contact information for [facility], they are primary care provider that will take anyone regardless of their insurance status, contact them to set up an appointment to establish for primary care.

## 2017-01-16 NOTE — ED Triage Notes (Signed)
Pt has been having left sciatica issues for quite some time.  She reports needing an MRI, but she has not been able to schedule one due to her fathers illness then death.  Pt has been having a lot of pain for the last two days.

## 2017-01-16 NOTE — ED Provider Notes (Signed)
CSN: [id number]     Arrival date & time 01/16/17  1220 History   First MD Initiated Contact with Patient 01/16/17 1349     Chief Complaint  Patient presents with  . Sciatica    left   (Consider location/radiation/quality/duration/timing/severity/associated sxs/prior Treatment) 46 year old female presents to clinic for evaluation of left hip pain. States she has a past history of sciatica, and that she is having a flare that has started over the last 2-3 days. States that she has a sharp burning pain, that radiates down her leg all the way to her toes. She denies any fever, chills, nausea, vomiting, or any other systemic symptoms. Denies any recent trauma, has not fallen, does state she has been lifting some heavier objects at work. Denies any other complaints.   The history is provided by the patient.    Past Medical History:  Diagnosis Date  . Thyroid disease    Past Surgical History:  Procedure Laterality Date  . ABDOMINAL HYSTERECTOMY    . APPENDECTOMY    . CESAREAN SECTION    . CHOLECYSTECTOMY    . KNEE SURGERY     History reviewed. No pertinent family history. Social History  Substance Use Topics  . Smoking status: Current Every Day Smoker    Packs/day: 0.50    Types: Cigarettes  . Smokeless tobacco: Never Used  . Alcohol use Not on file   OB History    No data available     Review of Systems  Constitutional: Negative.   HENT: Negative.   Respiratory: Negative.   Cardiovascular: Negative.   Gastrointestinal: Negative.   Musculoskeletal: Positive for back pain.  Neurological: Negative.     Allergies  Penicillins  Home Medications   Prior to Admission medications   Medication Sig Start Date End Date Taking? Authorizing Provider  HYDROcodone-acetaminophen (NORCO/VICODIN) 5-325 MG tablet Take 1 tablet by mouth every 6 (six) hours as needed. 01/16/17   Dorena BodoKennard, Derrich Gaby, NP  predniSONE (STERAPRED UNI-PAK 21 TAB) 10 MG (21) TBPK tablet Take by mouth daily. Take  6 tabs by mouth daily  for 2 days, then 5 tabs for 2 days, then 4 tabs for 2 days, then 3 tabs for 2 days, 2 tabs for 2 days, then 1 tab by mouth daily for 2 days 01/16/17   Dorena BodoKennard, Aijalon Demuro, NP   Meds Ordered and Administered this Visit   Medications  ketorolac (TORADOL) injection 60 mg (60 mg Intramuscular Given 01/16/17 1414)  dexamethasone (DECADRON) injection 10 mg (10 mg Intramuscular Given 01/16/17 1412)    BP 133/65 (BP Location: Left Arm)   Pulse 75   Temp 98.6 F (37 C) (Oral)   SpO2 99%  No data found.   Physical Exam  Constitutional: She is oriented to person, place, and time. She appears well-developed and well-nourished. No distress.  HENT:  Head: Normocephalic and atraumatic.  Right Ear: External ear normal.  Left Ear: External ear normal.  Eyes: Conjunctivae are normal.  Neck: Normal range of motion.  Musculoskeletal:       Left hip: She exhibits decreased range of motion and tenderness. She exhibits no bony tenderness, no swelling and no deformity.  Unable to lay flat to do leg raise due to pain  Neurological: She is alert and oriented to person, place, and time.  Skin: Skin is warm and dry. Capillary refill takes less than 2 seconds. No rash noted. She is not diaphoretic. No erythema.  Psychiatric: She has a normal mood and affect. Her behavior  is normal.  Nursing note and vitals reviewed.   Urgent Care Course     Procedures (including critical care time)  Labs Review Labs Reviewed - No data to display  Imaging Review No results found.    MDM   1. Sciatica of left side     Given short course of presnisone and hydrocodone for pain, Patient provided contact information for community health and wellness to establish for primary care. Follow-up as necessary for further evaluation and treatment of her pain.  [place] controlled substances reporting system consulted prior to issuing prescription, no abnormal activity detected based on the  report.    Dorena Bodo, NP 01/16/17 1520

## 2017-04-01 ENCOUNTER — Ambulatory Visit (HOSPITAL_COMMUNITY)
Admission: EM | Admit: 2017-04-01 | Discharge: 2017-04-01 | Disposition: A | Discharge status: Home / Self Care | Payer: Managed Care, Other (non HMO) | Attending: Family Medicine | Admitting: Family Medicine

## 2017-04-01 ENCOUNTER — Encounter (HOSPITAL_COMMUNITY): Payer: Self-pay | Admitting: *Deleted

## 2017-04-01 DIAGNOSIS — Z880 Allergy status to penicillin: Secondary | ICD-10-CM | POA: Diagnosis not present

## 2017-04-01 DIAGNOSIS — F17210 Nicotine dependence, cigarettes, uncomplicated: Secondary | ICD-10-CM | POA: Insufficient documentation

## 2017-04-01 DIAGNOSIS — M62838 Other muscle spasm: Secondary | ICD-10-CM | POA: Diagnosis not present

## 2017-04-01 DIAGNOSIS — G8929 Other chronic pain: Secondary | ICD-10-CM | POA: Diagnosis not present

## 2017-04-01 DIAGNOSIS — M5442 Lumbago with sciatica, left side: Secondary | ICD-10-CM

## 2017-04-01 DIAGNOSIS — N390 Urinary tract infection, site not specified: Secondary | ICD-10-CM | POA: Diagnosis not present

## 2017-04-01 DIAGNOSIS — R350 Frequency of micturition: Secondary | ICD-10-CM | POA: Diagnosis present

## 2017-04-01 DIAGNOSIS — M545 Low back pain: Secondary | ICD-10-CM | POA: Diagnosis present

## 2017-04-01 DIAGNOSIS — R300 Dysuria: Secondary | ICD-10-CM | POA: Diagnosis not present

## 2017-04-01 DIAGNOSIS — E079 Disorder of thyroid, unspecified: Secondary | ICD-10-CM | POA: Diagnosis not present

## 2017-04-01 LAB — POCT URINALYSIS DIP (DEVICE)
BILIRUBIN URINE: NEGATIVE
GLUCOSE, UA: NEGATIVE mg/dL
Hgb urine dipstick: NEGATIVE
Ketones, ur: NEGATIVE mg/dL
LEUKOCYTES UA: NEGATIVE
NITRITE: POSITIVE — AB
Protein, ur: NEGATIVE mg/dL
Specific Gravity, Urine: 1.01 (ref 1.005–1.030)
Urobilinogen, UA: 0.2 mg/dL (ref 0.0–1.0)
pH: 7.5 (ref 5.0–8.0)

## 2017-04-01 MED ORDER — PREDNISONE 10 MG (21) PO TBPK: ORAL_TABLET | ORAL | 0 refills | Status: DC

## 2017-04-01 MED ORDER — KETOROLAC TROMETHAMINE 60 MG/2ML IM SOLN
45.0000 mg | Freq: Once | INTRAMUSCULAR | Status: AC
  Administered 2017-04-01: 45 mg via INTRAMUSCULAR

## 2017-04-01 MED ORDER — HYDROCODONE-ACETAMINOPHEN 5-325 MG PO TABS: 1.0000 | ORAL_TABLET | ORAL | 0 refills | Status: DC | PRN

## 2017-04-01 MED ORDER — KETOROLAC TROMETHAMINE 60 MG/2ML IM SOLN
INTRAMUSCULAR | Status: AC
  Filled 2017-04-01: qty 2

## 2017-04-01 MED ORDER — CYCLOBENZAPRINE HCL 5 MG PO TABS: 5.0000 mg | ORAL_TABLET | Freq: Three times a day (TID) | ORAL | 0 refills | Status: DC | PRN

## 2017-04-01 MED ORDER — CEPHALEXIN 500 MG PO CAPS: 500.0000 mg | ORAL_CAPSULE | Freq: Four times a day (QID) | ORAL | 0 refills | Status: DC

## 2017-04-01 NOTE — ED Triage Notes (Addendum)
Patient reports cloudy urine, foul smelling urine, and dysuria x 8 days. Called [facility] and was given prescription for bactrim. States that symptoms have not subsided and is on last day of prescription.   Patient also reports left lower back pain radiating down into left leg with numbness and tingling.

## 2017-04-01 NOTE — Discharge Instructions (Signed)
Take the medication as directed. Some may cause drowsiness. Be sure to drink plenty of fluids and stay well-hydrated. Follow-up with primary care doctor as needed.

## 2017-04-01 NOTE — ED Provider Notes (Addendum)
[facility]    CSN: [id number] Arrival date & time: 04/01/17  1517     History   Chief Complaint Chief Complaint  Patient presents with  . Back Pain  . Dysuria    HPI Alejandra Jackson is a 46 y.o. female.   46 year old female presents to the urgent care complaining of a two-day history of cloudy urine, dysuria, urinary frequency and urgency. She was seen by video.and was prescribed Bactrim. She has taken all but 1 tablet and she is not been getting better. This manner fact she feels worse. Second complaint is acute on chronic low back pain with radiation of pain to the left leg. She has had visits to the urgent care and PCP with this complaint a few times this year.      Past Medical History:  Diagnosis Date  . Thyroid disease     Patient Active Problem List   Diagnosis Date Noted  . Acute left-sided low back pain with left-sided sciatica 11/12/2016  . Strain of lumbar region 11/12/2016  . Muscle spasm 11/12/2016  . Acute UTI (urinary tract infection) 11/12/2016    Past Surgical History:  Procedure Laterality Date  . ABDOMINAL HYSTERECTOMY    . APPENDECTOMY    . CESAREAN SECTION    . CHOLECYSTECTOMY    . KNEE SURGERY      OB History    No data available       Home Medications    Prior to Admission medications   Medication Sig Start Date End Date Taking? Authorizing Provider  acetaminophen (TYLENOL) 325 MG tablet Take 650 mg by mouth every 6 (six) hours as needed.   Yes [provider]  ibuprofen (ADVIL,MOTRIN) 800 MG tablet Take 800 mg by mouth every 8 (eight) hours as needed.   Yes [provider]  cephALEXin (KEFLEX) 500 MG capsule Take 1 capsule (500 mg total) by mouth 4 (four) times daily. 04/01/17   Hayden Rasmussen, NP  cyclobenzaprine (FLEXERIL) 5 MG tablet Take 1 tablet (5 mg total) by mouth 3 (three) times daily as needed for muscle spasms. 04/01/17   Hayden Rasmussen, NP  HYDROcodone-acetaminophen (NORCO/VICODIN) 5-325 MG tablet  Take 1 tablet by mouth every 4 (four) hours as needed. 04/01/17   Hayden Rasmussen, NP  predniSONE (STERAPRED UNI-PAK 21 TAB) 10 MG (21) TBPK tablet Dispense one 6 day pack. Take as directed with food. 04/01/17   Hayden Rasmussen, NP    Family History History reviewed. No pertinent family history.  Social History Social History  Substance Use Topics  . Smoking status: Current Every Day Smoker    Packs/day: 0.50    Types: Cigarettes  . Smokeless tobacco: Never Used  . Alcohol use Not on file     Allergies   Penicillins   Review of Systems Review of Systems  Constitutional: Negative for activity change, chills and fever.  HENT: Negative.   Respiratory: Negative.   Cardiovascular: Negative.   Genitourinary: Positive for dysuria and frequency. Negative for vaginal bleeding.  Musculoskeletal: Positive for back pain and myalgias.       As per HPI  Skin: Negative for color change, pallor and rash.  Neurological: Negative.   All other systems reviewed and are negative.    Physical Exam Triage Vital Signs ED Triage Vitals [04/01/17 1539]  Enc Vitals Group     BP (!) 140/100     Pulse Rate 79     Resp 17     Temp 98.6 F (37 C)  Temp Source Oral     SpO2 100 %     Weight      Height      Head Circumference      Peak Flow      Pain Score 10     Pain Loc      Pain Edu?      Excl. in GC?    No data found.   Updated Vital Signs BP (!) 140/100 (BP Location: Left Arm)   Pulse 79   Temp 98.6 F (37 C) (Oral)   Resp 17   SpO2 100%   Visual Acuity Right Eye Distance:   Left Eye Distance:   Bilateral Distance:    Right Eye Near:   Left Eye Near:    Bilateral Near:     Physical Exam  Constitutional: She is oriented to person, place, and time. She appears well-developed and well-nourished. No distress.  HENT:  Head: Normocephalic and atraumatic.  Eyes: EOM are normal.  Neck: Neck supple.  Cardiovascular: Normal rate.   Pulmonary/Chest: Effort normal.    Musculoskeletal: She exhibits tenderness. She exhibits no edema.  Tenderness to the lower left para lumbosacral musculature and upper left hip. No direct spinal tenderness or deformity. Patient points to the posterior thigh, calf and heel as the track of radicular pain.  Neurological: She is alert and oriented to person, place, and time. No cranial nerve deficit.  Skin: Skin is warm and dry.  Nursing note and vitals reviewed.    UC Treatments / Results  Labs (all labs ordered are listed, but only abnormal results are displayed) Labs Reviewed  POCT URINALYSIS DIP (DEVICE) - Abnormal; Notable for the following:       Result Value   Nitrite POSITIVE (*)    All other components within normal limits  URINE CULTURE    EKG  EKG Interpretation None       Radiology No results found.  Procedures Procedures (including critical care time)  Medications Ordered in UC Medications  ketorolac (TORADOL) injection 45 mg (not administered)     Initial Impression / Assessment and Plan / UC Course  I have reviewed the triage vital signs and the nursing notes.  Pertinent labs & imaging results that were available during my care of the patient were reviewed by me and considered in my medical decision making (see chart for details).     Take the medication as directed. Some may cause drowsiness. Be sure to drink plenty of fluids and stay well-hydrated. Follow-up with primary care doctor as needed.   Final Clinical Impressions(s) / UC Diagnoses   Final diagnoses:  Lower urinary tract infectious disease  Chronic left-sided low back pain with left-sided sciatica  Muscle spasm    New Prescriptions New Prescriptions   CEPHALEXIN (KEFLEX) 500 MG CAPSULE    Take 1 capsule (500 mg total) by mouth 4 (four) times daily.   CYCLOBENZAPRINE (FLEXERIL) 5 MG TABLET    Take 1 tablet (5 mg total) by mouth 3 (three) times daily as needed for muscle spasms.   HYDROCODONE-ACETAMINOPHEN  (NORCO/VICODIN) 5-325 MG TABLET    Take 1 tablet by mouth every 4 (four) hours as needed.   PREDNISONE (STERAPRED UNI-PAK 21 TAB) 10 MG (21) TBPK TABLET    Dispense one 6 day pack. Take as directed with food.     Controlled Substance Prescriptions [place] Controlled Substance Registry consulted? Yes, I have consulted the [place] Controlled Substances Registry for this patient, and feel the risk/benefit ratio  today is favorable for proceeding with this prescription for a controlled substance.   Hayden Rasmussen, NP 04/01/17 1640    Hayden Rasmussen, NP 04/01/17 2104

## 2017-04-03 LAB — URINE CULTURE: Special Requests: NORMAL

## 2017-05-15 ENCOUNTER — Ambulatory Visit (HOSPITAL_COMMUNITY)
Admission: EM | Admit: 2017-05-15 | Discharge: 2017-05-15 | Disposition: A | Discharge status: Home / Self Care | Payer: Managed Care, Other (non HMO) | Attending: Family Medicine | Admitting: Family Medicine

## 2017-05-15 ENCOUNTER — Encounter (HOSPITAL_COMMUNITY): Payer: Self-pay | Admitting: Family Medicine

## 2017-05-15 DIAGNOSIS — M5432 Sciatica, left side: Secondary | ICD-10-CM

## 2017-05-15 MED ORDER — KETOROLAC TROMETHAMINE 60 MG/2ML IM SOLN
INTRAMUSCULAR | Status: AC
  Filled 2017-05-15: qty 2

## 2017-05-15 MED ORDER — CYCLOBENZAPRINE HCL 5 MG PO TABS: 5.0000 mg | ORAL_TABLET | Freq: Three times a day (TID) | ORAL | 0 refills | Status: DC | PRN

## 2017-05-15 MED ORDER — HYDROCODONE-ACETAMINOPHEN 5-325 MG PO TABS: 1.0000 | ORAL_TABLET | ORAL | 0 refills | Status: DC | PRN

## 2017-05-15 MED ORDER — KETOROLAC TROMETHAMINE 60 MG/2ML IM SOLN
60.0000 mg | Freq: Once | INTRAMUSCULAR | Status: AC
  Administered 2017-05-15: 60 mg via INTRAMUSCULAR

## 2017-05-15 NOTE — Discharge Instructions (Addendum)
Please resume taking ibuprofen tomorrow as you received an injection of toradol today. Please call and establish with a primary care who can help you with referral for MRI. Looking back through your medical chart, it looks like you were approved to be seen at [facility] back in April. Please call them and schedule an appointment as these approvals are usually good for a year.

## 2017-05-15 NOTE — ED Triage Notes (Signed)
Pt here for chronic back pain with numbness and tingling down her left side. Reports knot on left back area. Now is experiencing left foot being cold at night and feeling asleep. sts that she needs a referral.

## 2017-05-15 NOTE — ED Provider Notes (Signed)
[facility]    CSN: [id number] Arrival date & time: 05/15/17  1050     History   Chief Complaint Chief Complaint  Patient presents with  . Back Pain  . Numbness  . Tingling    HPI Alejandra Jackson is a 46 y.o. female.   Patient is a 69 yo F who is here with chronic L sided low back pain which has been going on for the last 2 years. She states that she "tweaked" her back at work earlier today by turning and feels like her back is in spasm. States always has tingling and pain that shoots down the back of her L leg it is just worse than usual today with the acuteness of her L hip/low back pain. Has been taking 2 pills of tylenol twice a day with  of ibuprofen every 6 hours. She did not take her ibuprofen today. No falls. No urinary or bowel incontinence. Does have weakness in her L leg that she feels is due to the severity of her pain. Does take a muscle relaxer occasionally but only at night. Has had good relief with toradol injection in past urgent care visits. Has not yet been able to establish with PCP due to her work schedule and recent death in her family. Is requesting for referral for MRI.        Past Medical History:  Diagnosis Date  . Thyroid disease     Patient Active Problem List   Diagnosis Date Noted  . Acute left-sided low back pain with left-sided sciatica 11/12/2016  . Strain of lumbar region 11/12/2016  . Muscle spasm 11/12/2016  . Acute UTI (urinary tract infection) 11/12/2016    Past Surgical History:  Procedure Laterality Date  . ABDOMINAL HYSTERECTOMY    . APPENDECTOMY    . CESAREAN SECTION    . CHOLECYSTECTOMY    . KNEE SURGERY      OB History    No data available       Home Medications    Prior to Admission medications   Medication Sig Start Date End Date Taking? Authorizing Provider  acetaminophen (TYLENOL) 325 MG tablet Take 650 mg by mouth every 6 (six) hours as needed.    [provider]  cephALEXin (KEFLEX)  500 MG capsule Take 1 capsule (500 mg total) by mouth 4 (four) times daily. 04/01/17   Hayden Rasmussen, NP  cyclobenzaprine (FLEXERIL) 5 MG tablet Take 1 tablet (5 mg total) by mouth 3 (three) times daily as needed for muscle spasms. 04/01/17   Hayden Rasmussen, NP  HYDROcodone-acetaminophen (NORCO/VICODIN) 5-325 MG tablet Take 1 tablet by mouth every 4 (four) hours as needed. 04/01/17   Hayden Rasmussen, NP  ibuprofen (ADVIL,MOTRIN) 800 MG tablet Take 800 mg by mouth every 8 (eight) hours as needed.    [provider]  predniSONE (STERAPRED UNI-PAK 21 TAB) 10 MG (21) TBPK tablet Dispense one 6 day pack. Take as directed with food. 04/01/17   Hayden Rasmussen, NP    Family History History reviewed. No pertinent family history.  Social History Social History  Substance Use Topics  . Smoking status: Current Every Day Smoker    Packs/day: 0.50    Types: Cigarettes  . Smokeless tobacco: Never Used  . Alcohol use Not on file     Allergies   Penicillins   Review of Systems Review of Systems  Constitutional: Negative for chills and fever.  Respiratory: Negative for shortness of breath.   Cardiovascular: Negative for  chest pain and leg swelling.  Gastrointestinal: Negative for abdominal pain, constipation, diarrhea, nausea and vomiting.  Genitourinary: Negative for difficulty urinating.  Musculoskeletal: Positive for back pain (L lower back/hip pain radiating down L leg).  Skin: Negative for color change, pallor and rash.  Neurological: Negative for numbness.     Physical Exam Triage Vital Signs ED Triage Vitals  Enc Vitals Group     BP 05/15/17 1105 (!) 167/102     Pulse Rate 05/15/17 1105 75     Resp 05/15/17 1105 18     Temp 05/15/17 1105 98.5 F (36.9 C)     Temp src --      SpO2 05/15/17 1105 97 %     Weight --      Height --      Head Circumference --      Peak Flow --      Pain Score 05/15/17 1107 10     Pain Loc --      Pain Edu? --      Excl. in GC? --    No data  found.   Updated Vital Signs BP (!) 167/102   Pulse 75   Temp 98.5 F (36.9 C)   Resp 18   SpO2 97%   Visual Acuity Right Eye Distance:   Left Eye Distance:   Bilateral Distance:    Right Eye Near:   Left Eye Near:    Bilateral Near:     Physical Exam  Constitutional: She is oriented to person, place, and time. She appears well-developed and well-nourished. No distress.  HENT:  Head: Normocephalic and atraumatic.  Eyes: Conjunctivae are normal.  Neck: Normal range of motion.  Pulmonary/Chest: Effort normal.  Abdominal: Soft. She exhibits no distension. There is no tenderness. There is no rebound and no guarding.  Musculoskeletal: She exhibits tenderness (TTP over L SI joint). She exhibits no edema or deformity.  AROM limited at L hip due to pain, unable to tolerate laying flat for further evaluation but strength 5/5 in LE with sensation intact  Neurological: She is alert and oriented to person, place, and time. She displays normal reflexes. No sensory deficit. She exhibits normal muscle tone. Coordination normal.  Skin: Skin is warm and dry. Capillary refill takes less than 2 seconds. No rash noted. No erythema.  Psychiatric: She has a normal mood and affect.     UC Treatments / Results  Labs (all labs ordered are listed, but only abnormal results are displayed) Labs Reviewed - No data to display  EKG  EKG Interpretation None       Radiology No results found.  Procedures Procedures (including critical care time)  Medications Ordered in UC Medications - No data to display   Initial Impression / Assessment and Plan / UC Course  I have reviewed the triage vital signs and the nursing notes.  Pertinent labs & imaging results that were available during my care of the patient were reviewed by me and considered in my medical decision making (see chart for details).    Patient is a 46 yo F with know L sided sciatica who presents with chronic L sided low back pain  radiating down L leg with acute worsening today likely from a muscle spasm. No red flags on history or exam today. Given toradol injection today as patient has had relief with this in the past. Discussed at length with patient that she ultimately needs to establish with PCP for referral and to try contact  [facility] as per office note from [place] in April 2018. Patient given contact information for several primary care offices and for [facility]. Given acute spasm, refilled flexeril and norco for short course. Per [place] PMP, patient has had no refills of narcotics since last seen here in August. Patient given return precautions.   Final Clinical Impressions(s) / UC Diagnoses   Final diagnoses:  Sciatica of left side    New Prescriptions Discharge Medication List as of 05/15/2017 11:42 AM       Controlled Substance Prescriptions [place] Controlled Substance Registry consulted? Yes, I have consulted the [place] Controlled Substances Registry for this patient, and feel the risk/benefit ratio today is favorable for proceeding with this prescription for a controlled substance.   Leland Her, DO 05/15/17 1201

## 2017-07-31 ENCOUNTER — Ambulatory Visit (HOSPITAL_COMMUNITY)
Admission: EM | Admit: 2017-07-31 | Discharge: 2017-07-31 | Disposition: A | Discharge status: Home / Self Care | Payer: Managed Care, Other (non HMO) | Attending: Family Medicine | Admitting: Family Medicine

## 2017-07-31 ENCOUNTER — Encounter (HOSPITAL_COMMUNITY): Payer: Self-pay | Admitting: Emergency Medicine

## 2017-07-31 DIAGNOSIS — S39012A Strain of muscle, fascia and tendon of lower back, initial encounter: Secondary | ICD-10-CM | POA: Diagnosis not present

## 2017-07-31 MED ORDER — KETOROLAC TROMETHAMINE 60 MG/2ML IM SOLN
60.0000 mg | Freq: Once | INTRAMUSCULAR | Status: AC
  Administered 2017-07-31: 60 mg via INTRAMUSCULAR

## 2017-07-31 MED ORDER — KETOROLAC TROMETHAMINE 60 MG/2ML IM SOLN
INTRAMUSCULAR | Status: AC
  Filled 2017-07-31: qty 2

## 2017-07-31 MED ORDER — HYDROCODONE-ACETAMINOPHEN 5-325 MG PO TABS: 1.0000 | ORAL_TABLET | ORAL | 0 refills | Status: DC | PRN

## 2017-07-31 MED ORDER — PREDNISONE 20 MG PO TABS: ORAL_TABLET | ORAL | 0 refills | Status: DC

## 2017-07-31 MED ORDER — CYCLOBENZAPRINE HCL 5 MG PO TABS: 5.0000 mg | ORAL_TABLET | Freq: Every day | ORAL | 0 refills | Status: DC

## 2017-07-31 NOTE — Discharge Instructions (Signed)
Follow up with your orthopedist if pain persists.

## 2017-07-31 NOTE — ED Triage Notes (Signed)
PT C/O: lower back pain .... Reports she tipped over to the floor, while sitting on the floor.... Reports she possible pulled a muscle    ONSET: 2 days  TAKING MEDS: tylenol, ibuprofen, muscle relaxer,   A&O x4... NAD... Ambulatory

## 2017-07-31 NOTE — ED Provider Notes (Signed)
[facility]   [id number] 07/31/17 Arrival Time: 1036   SUBJECTIVE:  Alejandra Jackson is a 46 y.o. 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  No fever, abdominal symptoms, urinary symptoms  Taking ibuprofen and muscle relaxer.  Does sit down office work   Past Medical History:  Diagnosis Date  . Thyroid disease    History reviewed. No pertinent family history. Social History   Socioeconomic History  . Marital status: Divorced    Spouse name: Not on file  . Number of children: Not on file  . Years of education: Not on file  . Highest education level: Not on file  Social Needs  . Financial resource strain: Not on file  . Food insecurity - worry: Not on file  . Food insecurity - inability: Not on file  . Transportation needs - medical: Not on file  . Transportation needs - non-medical: Not on file  Occupational History  . Not on file  Tobacco Use  . Smoking status: Current Every Day Smoker    Packs/day: 0.50    Types: Cigarettes  . Smokeless tobacco: Never Used  Substance and Sexual Activity  . Alcohol use: Not on file  . Drug use: Not on file  . Sexual activity: Not on file  Other Topics Concern  . Not on file  Social History Narrative  . Not on file   Current Meds  Medication Sig  . acetaminophen (TYLENOL) 325 MG tablet Take 650 mg by mouth every 6 (six) hours as needed.  Marland Kitchen. ibuprofen (ADVIL,MOTRIN) 800 MG tablet Take 800 mg by mouth every 8 (eight) hours as needed.  . [DISCONTINUED] cyclobenzaprine (FLEXERIL) 5 MG tablet Take 1 tablet (5 mg total) by mouth 3 (three) times daily as needed for muscle spasms.   Allergies  Allergen Reactions  . Penicillins Rash      ROS: As per HPI, remainder of ROS negative.   OBJECTIVE:   Vitals:   07/31/17 1102  BP: (!) 158/92  Pulse: 79  Resp: 16  Temp: 98.7 F (37.1 C)  TempSrc: Oral    SpO2: 97%     General appearance: alert; no distress Eyes: PERRL; EOMI; conjunctiva normal HENT: normocephalic; atraumatic;  Neck: supple Back: no CVA tenderness, tender right para lumbar area Extremities: no cyanosis or edema; symmetrical with no gross deformities Skin: warm and dry Neurologic: normal gait; grossly normal;  Positive SLR on right Psychological: alert and cooperative; normal mood and affect      Labs:  Results for orders placed or performed during the hospital encounter of 04/01/17  Urine culture  Result Value Ref Range   Specimen Description URINE, CLEAN CATCH    Special Requests Normal    Culture <10,000 COLONIES/mL INSIGNIFICANT GROWTH (A)    Report Status 04/03/2017 FINAL   POCT urinalysis dip (device)  Result Value Ref Range   Glucose, UA NEGATIVE NEGATIVE mg/dL   Bilirubin Urine NEGATIVE NEGATIVE   Ketones, ur NEGATIVE NEGATIVE mg/dL   Specific Gravity, Urine 1.010 1.005 - 1.030   Hgb urine dipstick NEGATIVE NEGATIVE   pH 7.5 5.0 - 8.0   Protein, ur NEGATIVE NEGATIVE mg/dL   Urobilinogen, UA 0.2 0.0 - 1.0 mg/dL   Nitrite POSITIVE (A) NEGATIVE   Leukocytes, UA NEGATIVE NEGATIVE    Labs Reviewed - No data to display  No results found.     ASSESSMENT &  PLAN:  1. Strain of lumbar region, initial encounter     Meds ordered this encounter  Medications  . ketorolac (TORADOL) injection 60 mg  . HYDROcodone-acetaminophen (NORCO/VICODIN) 5-325 MG tablet    Sig: Take 1 tablet by mouth every 4 (four) hours as needed.    Dispense:  10 tablet    Refill:  0  . predniSONE (DELTASONE) 20 MG tablet    Sig: Two daily with food    Dispense:  10 tablet    Refill:  0  . cyclobenzaprine (FLEXERIL) 5 MG tablet    Sig: Take 1 tablet (5 mg total) by mouth at bedtime.    Dispense:  7 tablet    Refill:  0    Reviewed expectations re: course of current medical issues. Questions answered. Outlined signs and symptoms indicating need for more acute  intervention. Patient verbalized understanding. After Visit Summary given.    Procedures:      Elvina SidleLauenstein, Carnelius Hammitt, MD 07/31/17 1128

## 2017-09-04 ENCOUNTER — Ambulatory Visit (INDEPENDENT_AMBULATORY_CARE_PROVIDER_SITE_OTHER): Payer: Managed Care, Other (non HMO)

## 2017-09-04 ENCOUNTER — Ambulatory Visit (HOSPITAL_COMMUNITY)
Admission: EM | Admit: 2017-09-04 | Discharge: 2017-09-04 | Disposition: A | Discharge status: Home / Self Care | Payer: Managed Care, Other (non HMO) | Attending: Family Medicine | Admitting: Family Medicine

## 2017-09-04 ENCOUNTER — Encounter (HOSPITAL_COMMUNITY): Payer: Self-pay | Admitting: Emergency Medicine

## 2017-09-04 ENCOUNTER — Other Ambulatory Visit: Payer: Self-pay

## 2017-09-04 DIAGNOSIS — S9031XA Contusion of right foot, initial encounter: Secondary | ICD-10-CM | POA: Diagnosis not present

## 2017-09-04 DIAGNOSIS — W19XXXA Unspecified fall, initial encounter: Secondary | ICD-10-CM

## 2017-09-04 DIAGNOSIS — S93401A Sprain of unspecified ligament of right ankle, initial encounter: Secondary | ICD-10-CM

## 2017-09-04 DIAGNOSIS — W108XXA Fall (on) (from) other stairs and steps, initial encounter: Secondary | ICD-10-CM

## 2017-09-04 DIAGNOSIS — M79671 Pain in right foot: Secondary | ICD-10-CM

## 2017-09-04 MED ORDER — HYDROCODONE-ACETAMINOPHEN 5-325 MG PO TABS: 1.0000 | ORAL_TABLET | Freq: Four times a day (QID) | ORAL | 0 refills | Status: AC | PRN

## 2017-09-04 NOTE — Discharge Instructions (Signed)
No signs of fractures on x-ray.  This is likely a contusion to the foot and ankle sprain.  Thigh and buttock pain likely muscular contusion/strain also. Pain may take 1-2 weeks to resolve.  Use anti-inflammatories for pain/swelling. You may take up to 800 mg Ibuprofen every 8 hours with food. You may supplement Ibuprofen with Tylenol [phone] mg every 8 hours.   Please alternate using ice/heating pad to help with any discomfort.  Elevate foot to help with swelling.  Please follow up here or with your primary care if symptoms not improving in 2 weeks. Please return sooner if pain changes, worsens, develop numbness, tingling.

## 2017-09-04 NOTE — ED Triage Notes (Signed)
Pt states she fell down her steps outside last night and she has pain to her right foot and ankle.

## 2017-09-04 NOTE — ED Provider Notes (Signed)
[facility]    CSN: [id number] Arrival date & time: 09/04/17  1715     History   Chief Complaint Chief Complaint  Patient presents with  . Fall  . Foot Injury    right    HPI Alejandra Jackson is a 47 y.o. female a significant past medical history presenting today after a fall.  She fell down the stairs to her trailer last night around 9 PM.  She states that there is approximately 4 concrete stairs and she felt like she was getting towards the bottom.  She believes she fell onto her left knee and then onto the right side of her body.  She says she broke her left foot 2 months ago and so she is trying to protect this foot.  Today she has pain to the lateral aspect of her right foot her right ankle her right thigh and her right glutes.  She is able to bear weight although it is painful.  Denies hitting head, denies loss of consciousness.  She also has a healing sore on the anterior surface of her right lower extremity.  She states she had her shin on a bed frame last week.  It was healing well and scabbing.  But then after the fall she noticed some surrounding redness.  Is currently on a course of doxycycline for bronchitis.  HPI  Past Medical History:  Diagnosis Date  . Thyroid disease     Patient Active Problem List   Diagnosis Date Noted  . Acute left-sided low back pain with left-sided sciatica 11/12/2016  . Strain of lumbar region 11/12/2016  . Muscle spasm 11/12/2016  . Acute UTI (urinary tract infection) 11/12/2016    Past Surgical History:  Procedure Laterality Date  . ABDOMINAL HYSTERECTOMY    . APPENDECTOMY    . CESAREAN SECTION    . CHOLECYSTECTOMY    . KNEE SURGERY      OB History    No data available       Home Medications    Prior to Admission medications   Medication Sig Start Date End Date Taking? Authorizing Provider  acetaminophen (TYLENOL) 325 MG tablet Take 650 mg by mouth every 6 (six) hours as needed.   Yes [provider]   benzonatate (TESSALON) 100 MG capsule Take 200 mg by mouth 3 (three) times daily as needed for cough.   Yes [provider]  doxycycline (VIBRAMYCIN) 100 MG capsule Take 100 mg by mouth 2 (two) times daily.   Yes [provider]  ibuprofen (ADVIL,MOTRIN) 800 MG tablet Take 800 mg by mouth every 8 (eight) hours as needed.   Yes [provider]  cyclobenzaprine (FLEXERIL) 5 MG tablet Take 1 tablet (5 mg total) by mouth at bedtime. 07/31/17   Elvina SidleLauenstein, Kurt, MD  HYDROcodone-acetaminophen (NORCO/VICODIN) 5-325 MG tablet Take 1-2 tablets by mouth every 6 (six) hours as needed for up to 2 days. 09/04/17 09/06/17  Kaile Bixler C, PA-C  predniSONE (DELTASONE) 20 MG tablet Two daily with food 07/31/17   Elvina SidleLauenstein, Kurt, MD    Family History History reviewed. No pertinent family history.  Social History Social History   Tobacco Use  . Smoking status: Current Every Day Smoker    Packs/day: 0.50    Types: Cigarettes  . Smokeless tobacco: Never Used  Substance Use Topics  . Alcohol use: Not on file  . Drug use: Not on file     Allergies   Penicillins   Review of Systems  Review of Systems  Constitutional: Negative for fever.  Respiratory: Negative for shortness of breath.   Cardiovascular: Negative for chest pain.  Gastrointestinal: Negative for nausea and vomiting.  Musculoskeletal: Positive for arthralgias and myalgias. Negative for gait problem.  Neurological: Negative for dizziness, syncope, weakness, light-headedness and headaches.     Physical Exam Triage Vital Signs ED Triage Vitals  Enc Vitals Group     BP 09/04/17 1740 (!) 153/94     Pulse Rate 09/04/17 1740 81     Resp --      Temp 09/04/17 1740 98.7 F (37.1 C)     Temp Source 09/04/17 1740 Oral     SpO2 09/04/17 1740 97 %     Weight --      Height --      Head Circumference --      Peak Flow --      Pain Score 09/04/17 1737 8     Pain Loc --      Pain Edu? --      Excl. in GC? --     No data found.  Updated Vital Signs BP (!) 153/94 (BP Location: Left Arm)   Pulse 81   Temp 98.7 F (37.1 C) (Oral)   SpO2 97%    Physical Exam  Constitutional: She is oriented to person, place, and time. She appears well-developed and well-nourished. No distress.  HENT:  Head: Normocephalic and atraumatic.  Eyes: Conjunctivae are normal.  Neck: Neck supple.  Cardiovascular: Normal rate.  Pulmonary/Chest: Effort normal and breath sounds normal. No respiratory distress.  Abdominal: Soft. There is no tenderness.  Musculoskeletal: She exhibits no edema.  Right ankle and foot: Mild swelling and bruising to lateral aspect of dorsal surface of foot.  Mild swelling to lateral malleolus.  No obvious deformity.  Dorsalis pedis 2+.  Full range of motion of the ankle.  Sensation intact distally.  Nontender to palpation of the fibula on lateral aspect near the knee.  Full range of motion at hips and knee bilaterally.  Strength 5 out of 5.   Neurological: She is alert and oriented to person, place, and time.  Skin: Skin is warm and dry.  Small 1 cm area of scabbing with surrounding 1-2 mm of erythema.  Psychiatric: She has a normal mood and affect.  Nursing note and vitals reviewed.    UC Treatments / Results  Labs (all labs ordered are listed, but only abnormal results are displayed) Labs Reviewed - No data to display  EKG  EKG Interpretation None       Radiology Dg Foot Complete Right  Result Date: 09/04/2017 CLINICAL DATA:  Right lateral and posterior foot pain in region of the 5th metatarsal and calcaneus following a fall down steps. EXAM: RIGHT FOOT COMPLETE - 3+ VIEW COMPARISON:  None. FINDINGS: There is no evidence of fracture or dislocation. There is no evidence of arthropathy or other focal bone abnormality. Soft tissues are unremarkable. IMPRESSION: Normal examination. Electronically Signed   By: Beckie Salts M.D.   On: 09/04/2017 18:19    Procedures Procedures  (including critical care time)  Medications Ordered in UC Medications - No data to display   Initial Impression / Assessment and Plan / UC Course  I have reviewed the triage vital signs and the nursing notes.  Pertinent labs & imaging results that were available during my care of the patient were reviewed by me and considered in my medical decision making (see chart for details).  No evidence of a fracture on x-ray.  Pain on foot likely a contusion, ankle sprain.  We will treat with ankle brace.  Weight-bear as tolerated.  NSAIDs.  Ice and rest.  Patient requesting stronger pain medicine for at night.  Provided Norco for 2 days. Discussed strict return precautions. Patient verbalized understanding and is agreeable with plan.   Final Clinical Impressions(s) / UC Diagnoses   Final diagnoses:  Fall, initial encounter  Contusion of right foot, initial encounter  Sprain of right ankle, unspecified ligament, initial encounter    ED Discharge Orders        Ordered    HYDROcodone-acetaminophen (NORCO/VICODIN) 5-325 MG tablet  Every 6 hours PRN     09/04/17 1845       Controlled Substance Prescriptions [place] Controlled Substance Registry consulted? Yes, I have consulted the [place] Controlled Substances Registry for this patient, and feel the risk/benefit ratio today is favorable for proceeding with this prescription for a controlled substance.   Lew Dawes, [place] 09/04/17 1900

## 2017-09-04 NOTE — ED Notes (Signed)
Patient requesting to speak to provider.

## 2017-10-10 ENCOUNTER — Encounter (HOSPITAL_COMMUNITY): Payer: Self-pay | Admitting: Family Medicine

## 2017-10-10 ENCOUNTER — Ambulatory Visit (HOSPITAL_COMMUNITY)
Admission: EM | Admit: 2017-10-10 | Discharge: 2017-10-10 | Disposition: A | Discharge status: Home / Self Care | Payer: Managed Care, Other (non HMO) | Attending: Family Medicine | Admitting: Family Medicine

## 2017-10-10 DIAGNOSIS — S39012A Strain of muscle, fascia and tendon of lower back, initial encounter: Secondary | ICD-10-CM | POA: Diagnosis not present

## 2017-10-10 DIAGNOSIS — M5440 Lumbago with sciatica, unspecified side: Secondary | ICD-10-CM | POA: Diagnosis not present

## 2017-10-10 DIAGNOSIS — G8929 Other chronic pain: Secondary | ICD-10-CM | POA: Diagnosis not present

## 2017-10-10 MED ORDER — HYDROCODONE-ACETAMINOPHEN 5-325 MG PO TABS: 1.0000 | ORAL_TABLET | Freq: Four times a day (QID) | ORAL | 0 refills | Status: DC | PRN

## 2017-10-10 MED ORDER — PREDNISONE 20 MG PO TABS: ORAL_TABLET | ORAL | 0 refills | Status: DC

## 2017-10-10 NOTE — ED Provider Notes (Signed)
[facility]   [id number] 10/10/17 Arrival Time: 1122   SUBJECTIVE:  Alejandra Jackson is a 47 y.o. female who presents to the urgent care with complaint of left back pain. sts that this is chronic pain and she has been doing  physical therapy for it. sts pain in left lower back with radiation into upper back and shoulder. sts this flared up due to moving around furniture and such last night. She has taken ibuprofen and tylenol. Reports feels like a muscle spasm.   Past Medical History:  Diagnosis Date  . Thyroid disease    History reviewed. No pertinent family history. Social History   Socioeconomic History  . Marital status: Divorced    Spouse name: Not on file  . Number of children: Not on file  . Years of education: Not on file  . Highest education level: Not on file  Social Needs  . Financial resource strain: Not on file  . Food insecurity - worry: Not on file  . Food insecurity - inability: Not on file  . Transportation needs - medical: Not on file  . Transportation needs - non-medical: Not on file  Occupational History  . Not on file  Tobacco Use  . Smoking status: Current Every Day Smoker    Packs/day: 0.50    Types: Cigarettes  . Smokeless tobacco: Never Used  Substance and Sexual Activity  . Alcohol use: Not on file  . Drug use: Not on file  . Sexual activity: Not on file  Other Topics Concern  . Not on file  Social History Narrative  . Not on file   No outpatient medications have been marked as taking for the 10/10/17 encounter [facility] Encounter).   Allergies  Allergen Reactions  . Penicillins Rash      ROS: As per HPI, remainder of ROS negative.   OBJECTIVE:   Vitals:   10/10/17 1144  BP: (!) 141/70  Pulse: 81  Resp: 18  Temp: 98.2 F (36.8 C)  SpO2: 100%     General appearance: alert; no distress Eyes: PERRL; EOMI; conjunctiva normal HENT: normocephalic; atraumatic; TMs normal, canal normal, external ears normal without  trauma; nasal mucosa normal; oral mucosa normal Neck: supple Lungs: clear to auscultation bilaterally Heart: regular rate and rhythm Abdomen: soft, non-tender; bowel sounds normal; no masses or organomegaly; no guarding or rebound tenderness Back: no CVA tenderness Extremities: no cyanosis or edema; symmetrical with no gross deformities Skin: warm and dry Neurologic: normal gait; grossly normal Psychological: alert and cooperative; normal mood and affect      Labs:  Results for orders placed or performed during the hospital encounter of 04/01/17  Urine culture  Result Value Ref Range   Specimen Description URINE, CLEAN CATCH    Special Requests Normal    Culture <10,000 COLONIES/mL INSIGNIFICANT GROWTH (A)    Report Status 04/03/2017 FINAL   POCT urinalysis dip (device)  Result Value Ref Range   Glucose, UA NEGATIVE NEGATIVE mg/dL   Bilirubin Urine NEGATIVE NEGATIVE   Ketones, ur NEGATIVE NEGATIVE mg/dL   Specific Gravity, Urine 1.010 1.005 - 1.030   Hgb urine dipstick NEGATIVE NEGATIVE   pH 7.5 5.0 - 8.0   Protein, ur NEGATIVE NEGATIVE mg/dL   Urobilinogen, UA 0.2 0.0 - 1.0 mg/dL   Nitrite POSITIVE (A) NEGATIVE   Leukocytes, UA NEGATIVE NEGATIVE    Labs Reviewed - No data to display  No results found.     ASSESSMENT & PLAN:  No diagnosis found.  No orders of the defined types were placed in this encounter.   Reviewed expectations re: course of current medical issues. Questions answered. Outlined signs and symptoms indicating need for more acute intervention. Patient verbalized understanding. After Visit Summary given.    Procedures:      Elvina SidleLauenstein, Elizabth Palka, MD 10/10/17 1501

## 2017-10-10 NOTE — ED Triage Notes (Signed)
Pt here for left back pain. sts that this is chronic pain and she has been doing  physical therapy for it. sts pain in left lower back with radiation into upper back and shoulder. sts this flared up due to moving around furniture and such last night. She has taken ibuprofen and tylenol. Reports feels like a muscle spasm.

## 2017-11-12 ENCOUNTER — Other Ambulatory Visit: Payer: Self-pay

## 2017-11-12 ENCOUNTER — Encounter (HOSPITAL_COMMUNITY): Payer: Self-pay | Admitting: Emergency Medicine

## 2017-11-12 ENCOUNTER — Ambulatory Visit (HOSPITAL_COMMUNITY)
Admission: EM | Admit: 2017-11-12 | Discharge: 2017-11-12 | Disposition: A | Discharge status: Home / Self Care | Payer: Managed Care, Other (non HMO) | Attending: Family Medicine | Admitting: Family Medicine

## 2017-11-12 DIAGNOSIS — M5442 Lumbago with sciatica, left side: Secondary | ICD-10-CM | POA: Diagnosis not present

## 2017-11-12 DIAGNOSIS — G8929 Other chronic pain: Secondary | ICD-10-CM | POA: Diagnosis not present

## 2017-11-12 MED ORDER — HYDROCODONE-ACETAMINOPHEN 5-325 MG PO TABS: 2.0000 | ORAL_TABLET | ORAL | 0 refills | Status: DC | PRN

## 2017-11-12 MED ORDER — PREDNISONE 10 MG PO TABS: 10.0000 mg | ORAL_TABLET | Freq: Every day | ORAL | 0 refills | Status: DC

## 2017-11-12 MED ORDER — KETOROLAC TROMETHAMINE 60 MG/2ML IM SOLN
INTRAMUSCULAR | Status: AC
  Filled 2017-11-12: qty 2

## 2017-11-12 MED ORDER — KETOROLAC TROMETHAMINE 60 MG/2ML IM SOLN
60.0000 mg | Freq: Once | INTRAMUSCULAR | Status: AC
  Administered 2017-11-12: 60 mg via INTRAMUSCULAR

## 2017-11-12 NOTE — ED Triage Notes (Signed)
States has chronic back pain and has aggravated back after using lawn mower and picking up a heavy grocery bag yesterday

## 2017-11-12 NOTE — ED Provider Notes (Signed)
[facility]    CSN: [id number] Arrival date & time: 11/12/17  1656     History   Chief Complaint Chief Complaint  Patient presents with  . Back Pain    HPI Alejandra Jackson is a 47 y.o. female.   History of intermittent back pain.  Has been treated here previously for similar symptoms.  Current episode aggravated by lifting and working in yard.  She has taken some Flexeril without much relief, although it did make her drowsy. There is some radiation to her left leg to the foot.  HPI  Past Medical History:  Diagnosis Date  . Thyroid disease     Patient Active Problem List   Diagnosis Date Noted  . Acute left-sided low back pain with left-sided sciatica 11/12/2016  . Strain of lumbar region 11/12/2016  . Muscle spasm 11/12/2016  . Acute UTI (urinary tract infection) 11/12/2016    Past Surgical History:  Procedure Laterality Date  . ABDOMINAL HYSTERECTOMY    . APPENDECTOMY    . CESAREAN SECTION    . CHOLECYSTECTOMY    . KNEE SURGERY      OB History   None      Home Medications    Prior to Admission medications   Medication Sig Start Date End Date Taking? Authorizing Provider  acetaminophen (TYLENOL) 325 MG tablet Take 650 mg by mouth every 6 (six) hours as needed.    [provider]  cyclobenzaprine (FLEXERIL) 5 MG tablet Take 1 tablet (5 mg total) by mouth at bedtime. 07/31/17   Elvina SidleLauenstein, Kurt, MD  HYDROcodone-acetaminophen (NORCO) 5-325 MG tablet Take 1 tablet by mouth every 6 (six) hours as needed for moderate pain. 10/10/17   Elvina SidleLauenstein, Kurt, MD  predniSONE (DELTASONE) 20 MG tablet Two daily with food 10/10/17   Elvina SidleLauenstein, Kurt, MD    Family History No family history on file.  Social History Social History   Tobacco Use  . Smoking status: Current Every Day Smoker    Packs/day: 0.50    Types: Cigarettes  . Smokeless tobacco: Never Used  Substance Use Topics  . Alcohol use: Not on file  . Drug use: Not on file      Allergies   Penicillins   Review of Systems Review of Systems  Constitutional: Negative.   HENT: Negative.   Respiratory: Negative.   Cardiovascular: Negative.   Gastrointestinal: Negative.   Genitourinary: Negative.   Musculoskeletal: Positive for back pain and neck pain.  Neurological: Positive for numbness.  Psychiatric/Behavioral: Negative.      Physical Exam Triage Vital Signs ED Triage Vitals  Enc Vitals Group     BP 11/12/17 1723 (!) 139/100     Pulse Rate 11/12/17 1723 86     Resp --      Temp 11/12/17 1723 98.3 F (36.8 C)     Temp Source 11/12/17 1723 Oral     SpO2 11/12/17 1723 100 %     Weight --      Height --      Head Circumference --      Peak Flow --      Pain Score 11/12/17 1722 10     Pain Loc --      Pain Edu? --      Excl. in GC? --    No data found.  Updated Vital Signs BP (!) 139/100 (BP Location: Left Arm)   Pulse 86   Temp 98.3 F (36.8 C) (Oral)   SpO2 100%  Visual Acuity Right Eye Distance:   Left Eye Distance:   Bilateral Distance:    Right Eye Near:   Left Eye Near:    Bilateral Near:     Physical Exam  Constitutional: She is oriented to person, place, and time. She appears well-developed and well-nourished.  Cardiovascular: Normal rate and regular rhythm.  Pulmonary/Chest: Effort normal and breath sounds normal.  Musculoskeletal:  Back: There is some tightness in the left lumbar region.  There is tenderness left inner scapula and left trapezius muscle Straight leg raising is within normal limits Deep tendon reflexes are symmetric  Neurological: She is alert and oriented to person, place, and time. She displays normal reflexes. No sensory deficit. She exhibits normal muscle tone.     UC Treatments / Results  Labs (all labs ordered are listed, but only abnormal results are displayed) Labs Reviewed - No data to display  EKG None Radiology No results found.  Procedures Procedures (including critical care  time)  Medications Ordered in UC Medications - No data to display   Initial Impression / Assessment and Plan / UC Course  I have reviewed the triage vital signs and the nursing notes.  Pertinent labs & imaging results that were available during my care of the patient were reviewed by me and considered in my medical decision making (see chart for details).     Chronic low back pain with recent exacerbation.  Exam does not suggest disc disease but there is a slight possibility given her paresthesias in the left leg extending to the foot. Would continue home exercises that she has been instructed from physical therapy  Final Clinical Impressions(s) / UC Diagnoses   Final diagnoses:  None    ED Discharge Orders    None       Controlled Substance Prescriptions [place] Controlled Substance Registry consulted? Yes, I have consulted the [place] Controlled Substances Registry for this patient, and feel the risk/benefit ratio today is favorable for proceeding with this prescription for a controlled substance.   Frederica Kuster, MD 11/12/17 [phone]

## 2017-12-18 ENCOUNTER — Ambulatory Visit (HOSPITAL_COMMUNITY)
Admission: EM | Admit: 2017-12-18 | Discharge: 2017-12-18 | Disposition: A | Discharge status: Home / Self Care | Payer: Managed Care, Other (non HMO) | Attending: Family Medicine | Admitting: Family Medicine

## 2017-12-18 ENCOUNTER — Encounter (HOSPITAL_COMMUNITY): Payer: Self-pay | Admitting: Emergency Medicine

## 2017-12-18 DIAGNOSIS — M62830 Muscle spasm of back: Secondary | ICD-10-CM | POA: Diagnosis not present

## 2017-12-18 MED ORDER — KETOROLAC TROMETHAMINE 60 MG/2ML IM SOLN
INTRAMUSCULAR | Status: AC
  Filled 2017-12-18: qty 2

## 2017-12-18 MED ORDER — PREDNISONE 10 MG (21) PO TBPK: ORAL_TABLET | Freq: Every day | ORAL | 0 refills | Status: DC

## 2017-12-18 MED ORDER — HYDROCODONE-ACETAMINOPHEN 5-325 MG PO TABS: 1.0000 | ORAL_TABLET | Freq: Four times a day (QID) | ORAL | 0 refills | Status: DC | PRN

## 2017-12-18 MED ORDER — KETOROLAC TROMETHAMINE 60 MG/2ML IM SOLN
60.0000 mg | Freq: Once | INTRAMUSCULAR | Status: AC
  Administered 2017-12-18: 60 mg via INTRAMUSCULAR

## 2017-12-18 NOTE — ED Triage Notes (Signed)
PT has history of back problems. 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 PT reports back muscle "caught" last night and she has pain up and down back and into arm, neck , and leg.

## 2017-12-18 NOTE — Discharge Instructions (Addendum)
Be aware, pain medications may cause drowsiness. Please do not drive, operate heavy machinery or make important decisions while on this medication, it can cloud your judgement.  Urgent care providers appreciate that many patients coming to [place] are in severe pain and we wish to address their pain in the safest, most responsible manner. It is important to recognize however that the proper treatment of chronic pain differs from that of the pain of injuries and acute illnesses. Our goal is to provide quality, safe, personalized care and we thank you for giving [place] the opportunity to serve you.  The use of narcotics and related agents for chronic pain syndromes may lead to additional physical and psychological problems. Nearly as many people die from prescription narcotics each year as die from car crashes. Additionally, this risk is increased if such prescriptions are obtained from a variety of sources. Therefore, only your primary care physician or a pain management specialist is able to safely treat such syndromes with narcotic medications long-term.  Documentation revealing such prescriptions have been sought from multiple sources may prohibit [place] from providing a refill or different narcotic medication. Your name may be checked first through the [facility] Controlled Substances Reporting System. This database is a record of controlled substance medication prescriptions that the patient has received. This has been established by [facility] in an effort to eliminate the dangerous, and often life threatening, practice of obtaining multiple prescriptions from different medical providers.   If you have a chronic pain syndrome (i.e. chronic headaches, recurrent back or neck pain, dental pain, abdominal or pelvis pain without a specific diagnosis, or neuropathic pain such as fibromyalgia) or recurrent visits for the same condition without an acute diagnosis, you may be treated with non-narcotics and other  non-addictive medicines.  Allergic reactions or negative side effects that may be reported by a patient to such medications will not typically lead to the use of a narcotic analgesic or other controlled substance as an alternative.   Patients managing chronic pain with a personal physician should have provisions in place for breakthrough pain.  If you are in crisis, you should call your physician.  If your physician directs you to the urgent care or the emergency department please have the doctor call and speak to our attending physician concerning your care.   When patients come to the Urgent Care or Emergency Department (ED) with acute medical conditions in which the provider feels appropriate to prescribe narcotic or sedating pain medication, he or she will prescribe these in very limited quantities.  The amount of these medications will last only until you can see your primary care physician in his/her office. Any patient who returns to the Urgent Care or ED seeking refills should expect only non-narcotic pain medications.   Prescriptions for narcotic or sedating medications that have been lost, stolen or expired will not be refilled.    Patients who have chronic pain may receive non-narcotic prescriptions until seen by their primary care physician. It is every patient?s personal responsibility to maintain active prescriptions with his or her primary care physician or specialist.

## 2017-12-19 NOTE — ED Provider Notes (Signed)
[facility]   [id number] 12/18/17 Arrival Time: 1458  ASSESSMENT & PLAN:  1. Muscle spasm of back    Discharge Instructions     Be aware, pain medications may cause drowsiness. Please do not drive, operate heavy machinery or make important decisions while on this medication, it can cloud your judgement.  Urgent care providers appreciate that many patients coming to [place] are in severe pain and we wish to address their pain in the safest, most responsible manner. It is important to recognize however that the proper treatment of chronic pain differs from that of the pain of injuries and acute illnesses. Our goal is to provide quality, safe, personalized care and we thank you for giving [place] the opportunity to serve you.  The use of narcotics and related agents for chronic pain syndromes may lead to additional physical and psychological problems. Nearly as many people die from prescription narcotics each year as die from car crashes. Additionally, this risk is increased if such prescriptions are obtained from a variety of sources. Therefore, only your primary care physician or a pain management specialist is able to safely treat such syndromes with narcotic medications long-term.  Documentation revealing such prescriptions have been sought from multiple sources may prohibit [place] from providing a refill or different narcotic medication. Your name may be checked first through the [facility] Controlled Substances Reporting System. This database is a record of controlled substance medication prescriptions that the patient has received. This has been established by [facility] in an effort to eliminate the dangerous, and often life threatening, practice of obtaining multiple prescriptions from different medical providers.   If you have a chronic pain syndrome (i.e. chronic headaches, recurrent back or neck pain, dental pain, abdominal or pelvis pain without a specific diagnosis, or neuropathic  pain such as fibromyalgia) or recurrent visits for the same condition without an acute diagnosis, you may be treated with non-narcotics and other non-addictive medicines.  Allergic reactions or negative side effects that may be reported by a patient to such medications will not typically lead to the use of a narcotic analgesic or other controlled substance as an alternative.   Patients managing chronic pain with a personal physician should have provisions in place for breakthrough pain.  If you are in crisis, you should call your physician.  If your physician directs you to the urgent care or the emergency department please have the doctor call and speak to our attending physician concerning your care.   When patients come to the Urgent Care or Emergency Department (ED) with acute medical conditions in which the provider feels appropriate to prescribe narcotic or sedating pain medication, he or she will prescribe these in very limited quantities. The amount of these medications will last only until you can see your primary care physician in his/her office. Any patient who returns to the Urgent Care or ED seeking refills should expect only non-narcotic pain medications.   Prescriptions for narcotic or sedating medications that have been lost, stolen or expired will not be refilled.    Patients who have chronic pain may receive non-narcotic prescriptions until seen by their primary care physician. It is every patient's personal responsibility to maintain active prescriptions with his or her primary care physician or specialist.  Meds ordered this encounter  Medications  . ketorolac (TORADOL) injection 60 mg  . predniSONE (STERAPRED UNI-PAK 21 TAB) 10 MG (21) TBPK tablet    Sig: Take by mouth daily. Take as directed.  Dispense:  21 tablet    Refill:  0  . HYDROcodone-acetaminophen (NORCO/VICODIN) 5-325 MG tablet    Sig: Take 1 tablet by mouth every 6 (six) hours as needed for moderate pain or severe  pain.    Dispense:  8 tablet    Refill:  0   Encouraged ROM as she tolerated. Also encouraged her to est care with a PCP. May need short term physical therapy or an orthopaedic evaluation if this continues.  [place] Controlled Substances Registry consulted for this patient. I feel the risk/benefit ratio today is favorable for proceeding with this prescription for a controlled substance. Medication sedation precautions given.  Reviewed expectations re: course of current medical issues. Questions answered. Outlined signs and symptoms indicating need for more acute intervention. Patient verbalized understanding. After Visit Summary given.  SUBJECTIVE: History from: patient. Alejandra Jackson is a 47 y.o. female who reports intermittent moderate to severe pain of her bilateral lower back that is stable; described as sharp 'like muscle spasms I've had before'; without radiation to extremities. Onset: abrupt, this episoded started last evening. 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 Relieved by: rest. Worsened by: certain movements. Associated symptoms: none reported. Extremity sensation changes or weakness: none. Self treatment: tried OTCs without relief of pain. History of similar: yes No bowel/bladder habit changes reported. Ambulatory without assistance.  ROS: As per HPI.   OBJECTIVE:  Vitals:   12/18/17 1618 12/18/17 1619  BP: (!) 162/93   Pulse: 75   Resp: 16   Temp: 98.4 F (36.9 C)   TempSrc: Oral   SpO2: 98%   Weight:  154 lb (69.9 kg)    General appearance: alert; no distress Neck: FROM; supple Heart: RRR Lungs: CTAB Extremities: warm and well perfused; symmetrical with no gross deformities; poorly localized tenderness reported over her lower back paraspinal musculature; no midline tenderness; with no swelling and no bruising; ROM: normal at hips but with reported back discomfort CV: normal extremity  capillary refill Skin: warm and dry Neurologic: normal gait; normal symmetric reflexes in all extremities; normal sensation in all extremities Psychological: alert and cooperative; normal mood and affect  Allergies  Allergen Reactions  . Penicillins Rash    Past Medical History:  Diagnosis Date  . Thyroid disease    Social History   Socioeconomic History  . Marital status: Divorced    Spouse name: Not on file  . Number of children: Not on file  . Years of education: Not on file  . Highest education level: Not on file  Occupational History  . Not on file  Social Needs  . Financial resource strain: Not on file  . Food insecurity:    Worry: Not on file    Inability: Not on file  . Transportation needs:    Medical: Not on file    Non-medical: Not on file  Tobacco Use  . Smoking status: Current Every Day Smoker    Packs/day: 0.50    Types: Cigarettes  . Smokeless tobacco: Never Used  Substance and Sexual Activity  . Alcohol use: Not on file  . Drug use: Not on file  . Sexual activity: Not on file  Lifestyle  . Physical activity:    Days per week: Not on file    Minutes per session: Not on file  . Stress: Not on file  Relationships  . Social connections:    Talks on phone: Not on file  Gets together: Not on file    Attends religious service: Not on file    Active member of club or organization: Not on file    Attends meetings of clubs or organizations: Not on file    Relationship status: Not on file  . Intimate partner violence:    Fear of current or ex partner: Not on file    Emotionally abused: Not on file    Physically abused: Not on file    Forced sexual activity: Not on file  Other Topics Concern  . Not on file  Social History Narrative  . Not on file   No family history on file. Past Surgical History:  Procedure Laterality Date  . ABDOMINAL HYSTERECTOMY    . APPENDECTOMY    . CESAREAN SECTION    . CHOLECYSTECTOMY    . KNEE SURGERY          Mardella Layman, MD 12/23/17 [phone]

## 2018-03-10 ENCOUNTER — Ambulatory Visit (HOSPITAL_COMMUNITY)
Admission: EM | Admit: 2018-03-10 | Discharge: 2018-03-10 | Disposition: A | Discharge status: Home / Self Care | Payer: Managed Care, Other (non HMO) | Attending: Family Medicine | Admitting: Family Medicine

## 2018-03-10 ENCOUNTER — Telehealth (HOSPITAL_COMMUNITY): Payer: Self-pay | Admitting: Emergency Medicine

## 2018-03-10 ENCOUNTER — Encounter (HOSPITAL_COMMUNITY): Payer: Self-pay

## 2018-03-10 DIAGNOSIS — M5489 Other dorsalgia: Secondary | ICD-10-CM | POA: Diagnosis not present

## 2018-03-10 DIAGNOSIS — M62830 Muscle spasm of back: Secondary | ICD-10-CM

## 2018-03-10 MED ORDER — TRAMADOL HCL 50 MG PO TABS: 50.0000 mg | ORAL_TABLET | Freq: Every evening | ORAL | 0 refills | Status: DC | PRN

## 2018-03-10 MED ORDER — TIZANIDINE HCL 4 MG PO TABS: 4.0000 mg | ORAL_TABLET | Freq: Three times a day (TID) | ORAL | 0 refills | Status: DC | PRN

## 2018-03-10 MED ORDER — METHYLPREDNISOLONE ACETATE 40 MG/ML IJ SUSP
80.0000 mg | Freq: Once | INTRAMUSCULAR | Status: AC
  Administered 2018-03-10: 80 mg via INTRAMUSCULAR

## 2018-03-10 MED ORDER — METHYLPREDNISOLONE ACETATE 80 MG/ML IJ SUSP
INTRAMUSCULAR | Status: AC
  Filled 2018-03-10: qty 1

## 2018-03-10 MED FILL — traMADol HCL 50 MG TABS: 50 | 5 days supply | Qty: 5 | Fill #0

## 2018-03-10 NOTE — ED Provider Notes (Signed)
[facility]    CSN: [id number] Arrival date & time: 03/10/18  [id number]     History   Chief Complaint Chief Complaint  Patient presents with  . Back Pain    HPI Alejandra Jackson is a 47 y.o. female.   Alejandra Jackson presents with upper back pain and spasm which started this morning. States was moving a heavy desk last night and maneuvering it on a stair well when she fell onto it with arms wrapped around it. Chronic low back pain, has been seeing PCP and p/t for this. Upper back now with tightness and spasm. Has been applying heat. Took 6 ibuprofen tabs and 2 extra strength tylenol this am which have not helped. No numbness tingling or weakness to arms or legs. Hx of thyroid disease, low back pain and sciatica.    ROS per HPI.      Past Medical History:  Diagnosis Date  . Thyroid disease     Patient Active Problem List   Diagnosis Date Noted  . Acute left-sided low back pain with left-sided sciatica 11/12/2016  . Strain of lumbar region 11/12/2016  . Muscle spasm 11/12/2016  . Acute UTI (urinary tract infection) 11/12/2016    Past Surgical History:  Procedure Laterality Date  . ABDOMINAL HYSTERECTOMY    . APPENDECTOMY    . CESAREAN SECTION    . CHOLECYSTECTOMY    . KNEE SURGERY      OB History   None      Home Medications    Prior to Admission medications   Medication Sig Start Date End Date Taking? Authorizing Provider  acetaminophen (TYLENOL) 325 MG tablet Take 650 mg by mouth every 6 (six) hours as needed.    [provider]  cyclobenzaprine (FLEXERIL) 5 MG tablet Take 1 tablet (5 mg total) by mouth at bedtime. 07/31/17   Elvina SidleLauenstein, Kurt, MD  HYDROcodone-acetaminophen (NORCO/VICODIN) 5-325 MG tablet Take 1 tablet by mouth every 6 (six) hours as needed for moderate pain or severe pain. 12/18/17   Mardella LaymanHagler, Brian, MD  predniSONE (STERAPRED UNI-PAK 21 TAB) 10 MG (21) TBPK tablet Take by mouth daily. Take as directed. 12/18/17   Mardella LaymanHagler, Brian, MD    tiZANidine (ZANAFLEX) 4 MG tablet Take 1 tablet (4 mg total) by mouth every 8 (eight) hours as needed for muscle spasms. 03/10/18   Georgetta HaberBurky, Sashia Campas B, NP  traMADol (ULTRAM) 50 MG tablet Take 1 tablet (50 mg total) by mouth at bedtime as needed. 03/10/18   Georgetta HaberBurky, Jennalynn Rivard B, NP    Family History History reviewed. No pertinent family history.  Social History Social History   Tobacco Use  . Smoking status: Current Every Day Smoker    Packs/day: 0.50    Types: Cigarettes  . Smokeless tobacco: Never Used  Substance Use Topics  . Alcohol use: Not on file  . Drug use: Not on file     Allergies   Penicillins   Review of Systems Review of Systems   Physical Exam Triage Vital Signs ED Triage Vitals  Enc Vitals Group     BP 03/10/18 0851 (!) 153/88     Pulse Rate 03/10/18 0851 89     Resp 03/10/18 0851 20     Temp 03/10/18 0851 98.6 F (37 C)     Temp Source 03/10/18 0851 Oral     SpO2 03/10/18 0851 100 %     Weight --      Height --      Head Circumference --  Peak Flow --      Pain Score 03/10/18 0904 10     Pain Loc --      Pain Edu? --      Excl. in GC? --    No data found.  Updated Vital Signs BP 140/79 (BP Location: Right Arm)   Pulse 71   Temp 98.3 F (36.8 C) (Oral)   Resp 19   SpO2 99%   Visual Acuity Right Eye Distance:   Left Eye Distance:   Bilateral Distance:    Right Eye Near:   Left Eye Near:    Bilateral Near:     Physical Exam  Constitutional: She is oriented to person, place, and time. She appears well-developed and well-nourished. No distress.  Cardiovascular: Normal rate, regular rhythm and normal heart sounds.  Pulmonary/Chest: Effort normal and breath sounds normal.  Musculoskeletal:       Thoracic back: She exhibits tenderness, pain and spasm. She exhibits normal range of motion, no bony tenderness, no swelling, no edema, no deformity, no laceration and normal pulse.       Back:  Thoracic back musculature with tenderness, pain  with ROM to upper back; full ROM and sensation to upper extremities; no step off or deformity to spinous process   Neurological: She is alert and oriented to person, place, and time.  Skin: Skin is warm and dry.     UC Treatments / Results  Labs (all labs ordered are listed, but only abnormal results are displayed) Labs Reviewed - No data to display  EKG None  Radiology No results found.  Procedures Procedures (including critical care time)  Medications Ordered in UC Medications  methylPREDNISolone acetate (DEPO-MEDROL) injection 80 mg (80 mg Intramuscular Given 03/10/18 0953)    Initial Impression / Assessment and Plan / UC Course  I have reviewed the triage vital signs and the nursing notes.  Pertinent labs & imaging results that were available during my care of the patient were reviewed by me and considered in my medical decision making (see chart for details).     Strain last night related to lifting heavy desk last night. No red flag findings. Patient requesting narcotic pain medication to help with pain at night. States she is unable to tolerate muscle relaxer's due to drowsiness. PMP with risk score of 610, last narcotic prescribed 7/3. Discussed that narcotics are not typically indicated for this injury, agreed to provide 5 tramadol tabs for night time use. Will try tizanidine to see if side effects tolerated better. Discussed ibuprofen dosing. Follow up with pcp for continued management. Patient verbalized understanding and agreeable to plan.  Ambulatory out of clinic without difficulty.   Final Clinical Impressions(s) / UC Diagnoses   Final diagnoses:  Muscle spasm of back     Discharge Instructions     May use tizandine to help with muscle spasms. Tramadol at night to help with pain. May cause drowsiness. Please do not take if driving or drinking alcohol.   Light and regular activity and stretches as able.  Please follow up with your primary care provider and  physical therapist for continued evaluation and management.    ED Prescriptions    Medication Sig Dispense Auth. Provider   tiZANidine (ZANAFLEX) 4 MG tablet  (Status: Discontinued) Take 1 tablet (4 mg total) by mouth every 8 (eight) hours as needed for muscle spasms. 20 tablet Linus Mako B, NP   traMADol (ULTRAM) 50 MG tablet  (Status: Discontinued) Take 1 tablet (50 mg  total) by mouth at bedtime as needed. 5 tablet Linus Mako B, NP   tiZANidine (ZANAFLEX) 4 MG tablet Take 1 tablet (4 mg total) by mouth every 8 (eight) hours as needed for muscle spasms. 20 tablet Linus Mako B, NP   traMADol (ULTRAM) 50 MG tablet Take 1 tablet (50 mg total) by mouth at bedtime as needed. 5 tablet Georgetta Haber, NP     Controlled Substance Prescriptions [place] Controlled Substance Registry consulted? Yes, I have consulted the  Controlled Substances Registry for this patient, and feel the risk/benefit ratio today is favorable for proceeding with this prescription for a controlled substance.   Georgetta Haber, NP 03/10/18 1008

## 2018-03-10 NOTE — ED Triage Notes (Signed)
Pt presents with ongoing back pain; may have re injured it carrying a table yesterday

## 2018-03-10 NOTE — Discharge Instructions (Signed)
May use tizandine to help with muscle spasms. Tramadol at night to help with pain. May cause drowsiness. Please do not take if driving or drinking alcohol.   Light and regular activity and stretches as able.  Please follow up with your primary care provider and physical therapist for continued evaluation and management.

## 2018-03-17 ENCOUNTER — Telehealth (HOSPITAL_COMMUNITY): Payer: Self-pay

## 2018-03-17 MED ORDER — TIZANIDINE HCL 4 MG PO TABS: 4.0000 mg | ORAL_TABLET | Freq: Three times a day (TID) | ORAL | 0 refills | Status: DC | PRN

## 2018-03-17 NOTE — Telephone Encounter (Signed)
Pt called upset, saying she has been trying to get in touch with this clinic about resending her Zanaflex. States that her pharmacy never received it. Per Dr. Delton SeeNelson, it is okay to resend it to patients pharmacy of choice.

## 2018-04-03 ENCOUNTER — Encounter: Payer: Self-pay | Admitting: Physician Assistant

## 2018-04-03 ENCOUNTER — Other Ambulatory Visit: Payer: Self-pay

## 2018-04-03 ENCOUNTER — Ambulatory Visit (INDEPENDENT_AMBULATORY_CARE_PROVIDER_SITE_OTHER): Payer: Managed Care, Other (non HMO)

## 2018-04-03 ENCOUNTER — Ambulatory Visit (INDEPENDENT_AMBULATORY_CARE_PROVIDER_SITE_OTHER): Payer: Managed Care, Other (non HMO) | Admitting: Physician Assistant

## 2018-04-03 VITALS — BP 130/86 | HR 77 | Temp 98.8°F | Resp 16 | Ht 65.0 in | Wt 147.4 lb

## 2018-04-03 DIAGNOSIS — G8929 Other chronic pain: Secondary | ICD-10-CM

## 2018-04-03 DIAGNOSIS — J029 Acute pharyngitis, unspecified: Secondary | ICD-10-CM

## 2018-04-03 DIAGNOSIS — M549 Dorsalgia, unspecified: Secondary | ICD-10-CM

## 2018-04-03 LAB — POCT RAPID STREP A (OFFICE): Rapid Strep A Screen: NEGATIVE

## 2018-04-03 MED ORDER — TIZANIDINE HCL 4 MG PO TABS: 4.0000 mg | ORAL_TABLET | Freq: Three times a day (TID) | ORAL | 0 refills | Status: DC | PRN

## 2018-04-03 MED ORDER — HYDROCODONE-ACETAMINOPHEN 5-325 MG PO TABS
1.0000 | ORAL_TABLET | Freq: Four times a day (QID) | ORAL | 0 refills | Status: AC | PRN
Start: 2018-04-03 — End: 2018-04-06

## 2018-04-03 NOTE — Progress Notes (Signed)
Alejandra Jackson  MRN: [id number] DOB: 04/17/1971  PCP: Patient, No Pcp Per  Subjective:  Pt is a 47 year old female who presents to clinic for several complaints  Back pain x 5 days. Pain radiates down legs.  Stretching and heat helps.  Tylenol and ibuprofen this morning.    Sore throat x 2 days Ear pain HA  No PCP.   Review of Systems  Constitutional: Negative for chills and fever.  HENT: Positive for congestion, ear pain and sore throat.   Respiratory: Negative for cough, chest tightness and shortness of breath.   Cardiovascular: Negative for chest pain and palpitations.  Musculoskeletal: Positive for back pain.  Neurological: Positive for headaches.    Patient Active Problem List   Diagnosis Date Noted  . Acute left-sided low back pain with left-sided sciatica 11/12/2016  . Strain of lumbar region 11/12/2016  . Muscle spasm 11/12/2016  . Acute UTI (urinary tract infection) 11/12/2016   Current Outpatient Medications on File Prior to Visit  Medication Sig Dispense Refill  . acetaminophen (TYLENOL) 325 MG tablet Take 650 mg by mouth every 6 (six) hours as needed.    Marland Kitchen. ibuprofen (ADVIL,MOTRIN) 200 MG tablet Take 200 mg by mouth every 6 (six) hours as needed.    . cyclobenzaprine (FLEXERIL) 5 MG tablet Take 1 tablet (5 mg total) by mouth at bedtime. (Patient not taking: Reported on 04/03/2018) 7 tablet 0  . HYDROcodone-acetaminophen (NORCO/VICODIN) 5-325 MG tablet Take 1 tablet by mouth every 6 (six) hours as needed for moderate pain or severe pain. (Patient not taking: Reported on 04/03/2018) 8 tablet 0  . tiZANidine (ZANAFLEX) 4 MG tablet Take 1 tablet (4 mg total) by mouth every 8 (eight) hours as needed for muscle spasms. (Patient not taking: Reported on 04/03/2018) 20 tablet 0  . traMADol (ULTRAM) 50 MG tablet Take 1 tablet (50 mg total) by mouth at bedtime as needed. (Patient not taking: Reported on 04/03/2018) 5 tablet 0   No current facility-administered medications on  file prior to visit.     Allergies  Allergen Reactions  . Penicillins Rash     Objective:  BP 130/86 (BP Location: Right Arm, Patient Position: Sitting, Cuff Size: Normal)   Pulse 77   Temp 98.8 F (37.1 C) (Oral)   Resp 16   Ht 5\' 5"  (1.651 m)   Wt 147 lb 6.4 oz (66.9 kg)   SpO2 97%   BMI 24.53 kg/m   Physical Exam  Constitutional: She is oriented to person, place, and time. No distress.  HENT:  Right Ear: Tympanic membrane normal.  Left Ear: Tympanic membrane normal.  Nose: Mucosal edema present. No rhinorrhea. Right sinus exhibits no maxillary sinus tenderness and no frontal sinus tenderness. Left sinus exhibits no maxillary sinus tenderness and no frontal sinus tenderness.  Mouth/Throat: Oropharynx is clear and moist and mucous membranes are normal.  Cardiovascular: Normal rate, regular rhythm and normal heart sounds.  Pulmonary/Chest: Effort normal and breath sounds normal. No respiratory distress. She has no wheezes. She has no rales.  Musculoskeletal:       Lumbar back: She exhibits decreased range of motion, tenderness and bony tenderness.  Neurological: She is alert and oriented to person, place, and time. She has normal strength. No sensory deficit. She displays a negative Romberg sign.  Negative SLR  Skin: Skin is warm and dry.  Psychiatric: Judgment normal.  Vitals reviewed.   Dg Lumbar Spine Complete  Result Date: 04/03/2018 CLINICAL DATA:  Low back  pain with radiculopathy EXAM: LUMBAR SPINE - COMPLETE 4+ VIEW COMPARISON:  None. FINDINGS: Five lumbar type vertebral bodies are well visualized. Mild scoliosis is noted concave to the left. No pars defects are noted. No anterolisthesis is seen. Mild osteophytic changes are noted. No soft tissue changes are seen. IMPRESSION: Chronic changes without acute abnormality. Electronically Signed   By: Alcide Clever M.D.   On: 04/03/2018 13:20    Results for orders placed or performed in visit on 04/03/18  POCT rapid strep  A  Result Value Ref Range   Rapid Strep A Screen Negative Negative   Assessment and Plan :  1. Chronic bilateral back pain, unspecified back location - Endorses acute on chronic low back pain. No red flags. X-ray shows Chronic changes without acute abnormality. Will refer to PT for eval and treatment.  - Ambulatory referral to Physical Therapy - DG Lumbar Spine Complete; Future - tiZANidine (ZANAFLEX) 4 MG tablet; Take 1 tablet (4 mg total) by mouth every 8 (eight) hours as needed for muscle spasms.  Dispense: 20 tablet; Refill: 0 - HYDROcodone-acetaminophen (NORCO/VICODIN) 5-325 MG tablet; Take 1 tablet by mouth every 6 (six) hours as needed for up to 3 days for moderate pain or severe pain.  Dispense: 15 tablet; Refill: 0  2. Sore throat - rapid strep negative. vitals are normal. In NAD. Will await culture to treat.  - POCT rapid strep A - Culture, Group A Strep   Marco Collie, PA-C  [facility] at [facility] 04/03/2018 12:20 PM  Please note: Portions of this report may have been transcribed using dragon voice recognition software. Every effort was made to ensure accuracy; however, inadvertent computerized transcription errors may be present.

## 2018-04-03 NOTE — Patient Instructions (Addendum)
Your condition is not something that should be managed with pain medication. physical therapy, stretching, heat and light exercise will help.  You will receive a phone call to schedule an appointment with physical therapy.  Continue taking ibuprofen and tylenol.   Your rapid strep test is negative. I am sending out a swab for further testing an will contact you with the results of your strep culture.   Stay well hydrated. Get lost of rest. Wash your hands often.   -Foods that can help speed recovery: honey, garlic, chicken soup, elderberries, green tea.  -Supplements that can help speed recovery: vitamin C, zinc, elderberry extract, quercetin, ginseng, selenium -Supplement with prebiotics and probiotics:   Advil or ibuprofen for pain. Do not take Aspirin.  Drink enough water and fluids to keep your urine clear or pale yellow.  For sore throat: ? Gargle with 8 oz of salt water ( tsp of salt per 1 qt of water) as often as every 1-2 hours to soothe your throat.  Gargle liquid benadryl.  Cepacol throat lozenges (if you are not at risk for choking).  For sore throat try using a honey-based tea. Use 3 teaspoons of honey with juice squeezed from half lemon. Place shaved pieces of ginger into 1/2-1 cup of water and warm over stove top. Then mix the ingredients and repeat every 4 hours as needed.  ?Use a humidifier in your bedroom ?Use an over-the-counter cough medicine, or suck on cough drops or hard candy ?Stop smoking, if you smoke ?If you have allergies, avoid the things you are allergic to (like pollen, dust, animals, or mold) If you have acid reflux, your doctor or nurse will tell you which lifestyle changes can help reduce symptoms.     If you have lab work done today you will be contacted with your lab results within the next 2 weeks.  If you have not heard from us then please contact us. The fastest way to get your results is to register for My Chart.   IF you received an x-ray  today, you will receive an invoice from [facility]. Please contact [facility] at [phone] with questions or concerns regarding your invoice.   IF you received labwork today, you will receive an invoice from [facility]. Please contact [facility] at [phone] with questions or concerns regarding your invoice.   Our billing staff will not be able to assist you with questions regarding bills from these companies.  You will be contacted with the lab results as soon as they are available. The fastest way to get your results is to activate your My Chart account. Instructions are located on the last page of this paperwork. If you have not heard from us regarding the results in 2 weeks, please contact this office.

## 2018-04-04 ENCOUNTER — Telehealth: Payer: Self-pay | Admitting: Physician Assistant

## 2018-04-04 NOTE — Telephone Encounter (Signed)
Copied from CRM [id number]. Topic: Quick Communication - See Telephone Encounter >> Apr 04, 2018 10:16 AM Luanna Coleawoud, Jessica L wrote: CRM for notification. See Telephone encounter for: 04/04/18. Pt called and stated that she would like to quit smoking and would like something called in. Pt stated that she would like something called in as soon as possible. Pt would like to quit this weekend. Neighborhood Psychologist, [facility] [place] [phone]

## 2018-04-06 ENCOUNTER — Telehealth: Payer: Self-pay

## 2018-04-06 NOTE — Telephone Encounter (Signed)
Copied from CRM [id number]. Topic: General - Other >> Apr 04, 2018  5:08 PM Marylen Ponto wrote: Reason for CRM: Pt states her throat is really sore and she is concerned. Pt requests a call back. Cb# [phone]  Message sent to [facility]

## 2018-04-07 LAB — CULTURE, GROUP A STREP: Strep A Culture: NEGATIVE

## 2018-04-08 NOTE — Telephone Encounter (Signed)
Please advise 

## 2018-04-10 ENCOUNTER — Encounter: Payer: Managed Care, Other (non HMO) | Admitting: Physician Assistant

## 2018-04-10 NOTE — Telephone Encounter (Signed)
I'd be happy to discuss medications for smoking cessation at her annual physical.

## 2018-05-28 ENCOUNTER — Other Ambulatory Visit: Payer: Self-pay | Admitting: Physician Assistant

## 2018-05-28 DIAGNOSIS — Z1231 Encounter for screening mammogram for malignant neoplasm of breast: Secondary | ICD-10-CM

## 2018-07-08 ENCOUNTER — Ambulatory Visit: Payer: Managed Care, Other (non HMO)

## 2019-02-09 ENCOUNTER — Encounter (HOSPITAL_COMMUNITY): Payer: Self-pay

## 2019-02-09 ENCOUNTER — Ambulatory Visit (HOSPITAL_COMMUNITY)
Admission: EM | Admit: 2019-02-09 | Discharge: 2019-02-09 | Disposition: A | Discharge status: Home / Self Care | Payer: Managed Care, Other (non HMO) | Attending: Emergency Medicine | Admitting: Emergency Medicine

## 2019-02-09 ENCOUNTER — Other Ambulatory Visit: Payer: Self-pay

## 2019-02-09 DIAGNOSIS — M5432 Sciatica, left side: Secondary | ICD-10-CM | POA: Diagnosis not present

## 2019-02-09 MED ORDER — KETOROLAC TROMETHAMINE 30 MG/ML IJ SOLN
30.0000 mg | Freq: Once | INTRAMUSCULAR | Status: AC
  Administered 2019-02-09: 30 mg via INTRAMUSCULAR

## 2019-02-09 MED ORDER — KETOROLAC TROMETHAMINE 30 MG/ML IJ SOLN
INTRAMUSCULAR | Status: AC
  Filled 2019-02-09: qty 1

## 2019-02-09 MED ORDER — HYDROCODONE-ACETAMINOPHEN 5-325 MG PO TABS
1.0000 | ORAL_TABLET | Freq: Four times a day (QID) | ORAL | 0 refills | Status: AC | PRN
Start: 2019-02-09 — End: ?

## 2019-02-09 MED ORDER — TIZANIDINE HCL 2 MG PO TABS: 2.0000 mg | ORAL_TABLET | Freq: Every day | ORAL | 0 refills | Status: AC

## 2019-02-09 MED ORDER — MELOXICAM 7.5 MG PO TABS: 7.5000 mg | ORAL_TABLET | Freq: Every day | ORAL | 0 refills | Status: AC

## 2019-02-09 MED FILL — HYDROCODON-APAP 5-325: 5-325 | 2 days supply | Qty: 5 | Fill #0

## 2019-02-09 NOTE — Discharge Instructions (Signed)
I have placed order for physical therapy, you will be called for this.  Daily meloxicam. Take with food. Don't take additional ibuprofen. Hydrocodone for severe breakthrough pain. May cause drowsiness. Please do not take if driving or drinking alcohol.  May cause constipation.  Tizanidine at night.  Light and regular activity as tolerated.  See exercises provided.  Heat application while active can help with muscle spasms.  Sleep with pillow under your knees.   Please establish with a primary care provider for long term management.

## 2019-02-09 NOTE — ED Provider Notes (Signed)
[facility]    CSN: [id number] Arrival date & time: 02/09/19  0801     History   Chief Complaint Chief Complaint  Patient presents with  . Sciatic Pain    HPI Alejandra Jackson is a 48 y.o. female.   Alejandra Jackson presents with complaints of left low back pain which flared up over the past few days. She had helped her mother move and felt gradual worsening of the pain. No specific injury. Pain radiates down posterior left thigh and leg. Pain to left buttock. Tingling sensation. Pain with walking. Has had before, was on a pain contract for this. Has not had physical therapy in this in the past. States she was never provided with an MRI. States she hasn't been able to follow up with her PCP due to outstanding balance. She has been trying ibuprofen and tylenol which have not helped with her pain. Ibuprofen last this morning. Heat has helped. No saddle symptoms.     ROS per HPI, negative if not otherwise mentioned.      Past Medical History:  Diagnosis Date  . Thyroid disease     Patient Active Problem List   Diagnosis Date Noted  . Acute left-sided low back pain with left-sided sciatica 11/12/2016  . Strain of lumbar region 11/12/2016  . Muscle spasm 11/12/2016  . Acute UTI (urinary tract infection) 11/12/2016    Past Surgical History:  Procedure Laterality Date  . ABDOMINAL HYSTERECTOMY    . APPENDECTOMY    . CESAREAN SECTION    . CHOLECYSTECTOMY    . KNEE SURGERY      OB History   No obstetric history on file.      Home Medications    Prior to Admission medications   Medication Sig Start Date End Date Taking? Authorizing Provider  acetaminophen (TYLENOL) 325 MG tablet Take 650 mg by mouth every 6 (six) hours as needed.    [provider]  HYDROcodone-acetaminophen (NORCO/VICODIN) 5-325 MG tablet Take 1 tablet by mouth every 6 (six) hours as needed for severe pain. 02/09/19   Georgetta HaberBurky, Yalena Colon B, NP  ibuprofen (ADVIL,MOTRIN) 200 MG tablet  Take 200 mg by mouth every 6 (six) hours as needed.    [provider]  meloxicam (MOBIC) 7.5 MG tablet Take 1 tablet (7.5 mg total) by mouth daily. 02/09/19   Georgetta HaberBurky, Gavriella Hearst B, NP  tiZANidine (ZANAFLEX) 2 MG tablet Take 1 tablet (2 mg total) by mouth at bedtime. 02/09/19   Georgetta HaberBurky, Bryssa Tones B, NP    Family History Family History  Family history unknown: Yes    Social History Social History   Tobacco Use  . Smoking status: Current Every Day Smoker    Packs/day: 0.50    Types: Cigarettes  . Smokeless tobacco: Never Used  Substance Use Topics  . Alcohol use: Yes    Comment: rare  . Drug use: Not on file     Allergies   Penicillins   Review of Systems Review of Systems   Physical Exam Triage Vital Signs ED Triage Vitals [02/09/19 0818]  Enc Vitals Group     BP (!) 200/102     Pulse Rate 77     Resp 18     Temp 98.5 F (36.9 C)     Temp Source Oral     SpO2 95 %     Weight      Height      Head Circumference      Peak Flow  Pain Score 9     Pain Loc      Pain Edu?      Excl. in [place]?    No data found.  Updated Vital Signs BP (!) 183/84 (BP Location: Right Arm)   Pulse 77   Temp 98.5 F (36.9 C) (Oral)   Resp 18   SpO2 95%   Visual Acuity Right Eye Distance:   Left Eye Distance:   Bilateral Distance:    Right Eye Near:   Left Eye Near:    Bilateral Near:     Physical Exam Constitutional:      General: She is not in acute distress.    Appearance: She is well-developed.  Cardiovascular:     Rate and Rhythm: Normal rate.  Pulmonary:     Effort: Pulmonary effort is normal.  Musculoskeletal:     Lumbar back: She exhibits tenderness and pain. She exhibits no swelling, no edema, no deformity, no laceration, no spasm and normal pulse.     Comments: strength equal bilaterally; gross sensation intact to lower extremities; left lower back pain pain; pain with left hip flexion and straight leg raise; pain with weight bearing to left leg  Skin:     General: Skin is warm and dry.  Neurological:     Mental Status: She is alert and oriented to person, place, and time.      UC Treatments / Results  Labs (all labs ordered are listed, but only abnormal results are displayed) Labs Reviewed - No data to display  EKG   Radiology No results found.  Procedures Procedures (including critical care time)  Medications Ordered in UC Medications  ketorolac (TORADOL) 30 MG/ML injection 30 mg (30 mg Intramuscular Given 02/09/19 0849)  ketorolac (TORADOL) 30 MG/ML injection (has no administration in time range)    Initial Impression / Assessment and Plan / UC Course  I have reviewed the triage vital signs and the nursing notes.  Pertinent labs & imaging results that were available during my care of the patient were reviewed by me and considered in my medical decision making (see chart for details).     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 Patient requesting hydrocodone, PMP with last provision of 12 tabs 5/27. 5 tabs provided today with daily meloxicam recommended and prn qhs tizanidine. Physical therapy orders placed and encouraged use as able. Encouraged follow up with PCP for recheck and management. Patient verbalized understanding and agreeable to plan.  Ambulatory out of clinic without difficulty.   Final Clinical Impressions(s) / UC Diagnoses   Final diagnoses:  Sciatica of left side     Discharge Instructions     I have placed order for physical therapy, you will be called for this.  Daily meloxicam. Take with food. Don't take additional ibuprofen. Hydrocodone for severe breakthrough pain. May cause drowsiness. Please do not take if driving or drinking alcohol.  May cause constipation.  Tizanidine at night.  Light and regular activity as tolerated.  See exercises provided.  Heat application while active can help with muscle spasms.  Sleep with pillow under your knees.   Please establish with a primary care provider for long  term management.     ED Prescriptions    Medication Sig Dispense Auth. Provider   meloxicam (MOBIC) 7.5 MG tablet Take 1 tablet (7.5 mg total) by mouth daily. 30 tablet Augusto Gamble B, NP   HYDROcodone-acetaminophen (NORCO/VICODIN) 5-325 MG tablet Take 1 tablet by mouth every 6 (six) hours as needed for  severe pain. 5 tablet Linus MakoBurky, Antoinetta Berrones B, NP   tiZANidine (ZANAFLEX) 2 MG tablet Take 1 tablet (2 mg total) by mouth at bedtime. 20 tablet Georgetta HaberBurky, Samar Venneman B, NP     Controlled Substance Prescriptions [place] Controlled Substance Registry consulted? Yes, I have consulted the  Controlled Substances Registry for this patient, and feel the risk/benefit ratio today is favorable for proceeding with this prescription for a controlled substance.   Georgetta HaberBurky, Wallace Gappa B, NP 02/09/19 [phone]

## 2019-02-09 NOTE — ED Triage Notes (Signed)
Pt presents with sciatic pain on left side from area down to the ankle X 2 days.

## 2019-12-22 IMAGING — DX DG FOOT COMPLETE 3+V*R*
3 series · 3 of 3 positions shown · non-contrast
Comparison: None.

CLINICAL DATA: Right lateral and posterior foot pain in region of
the 5th metatarsal and calcaneus following a fall down steps.

EXAM:
RIGHT FOOT COMPLETE - 3+ VIEW

[foot ap]
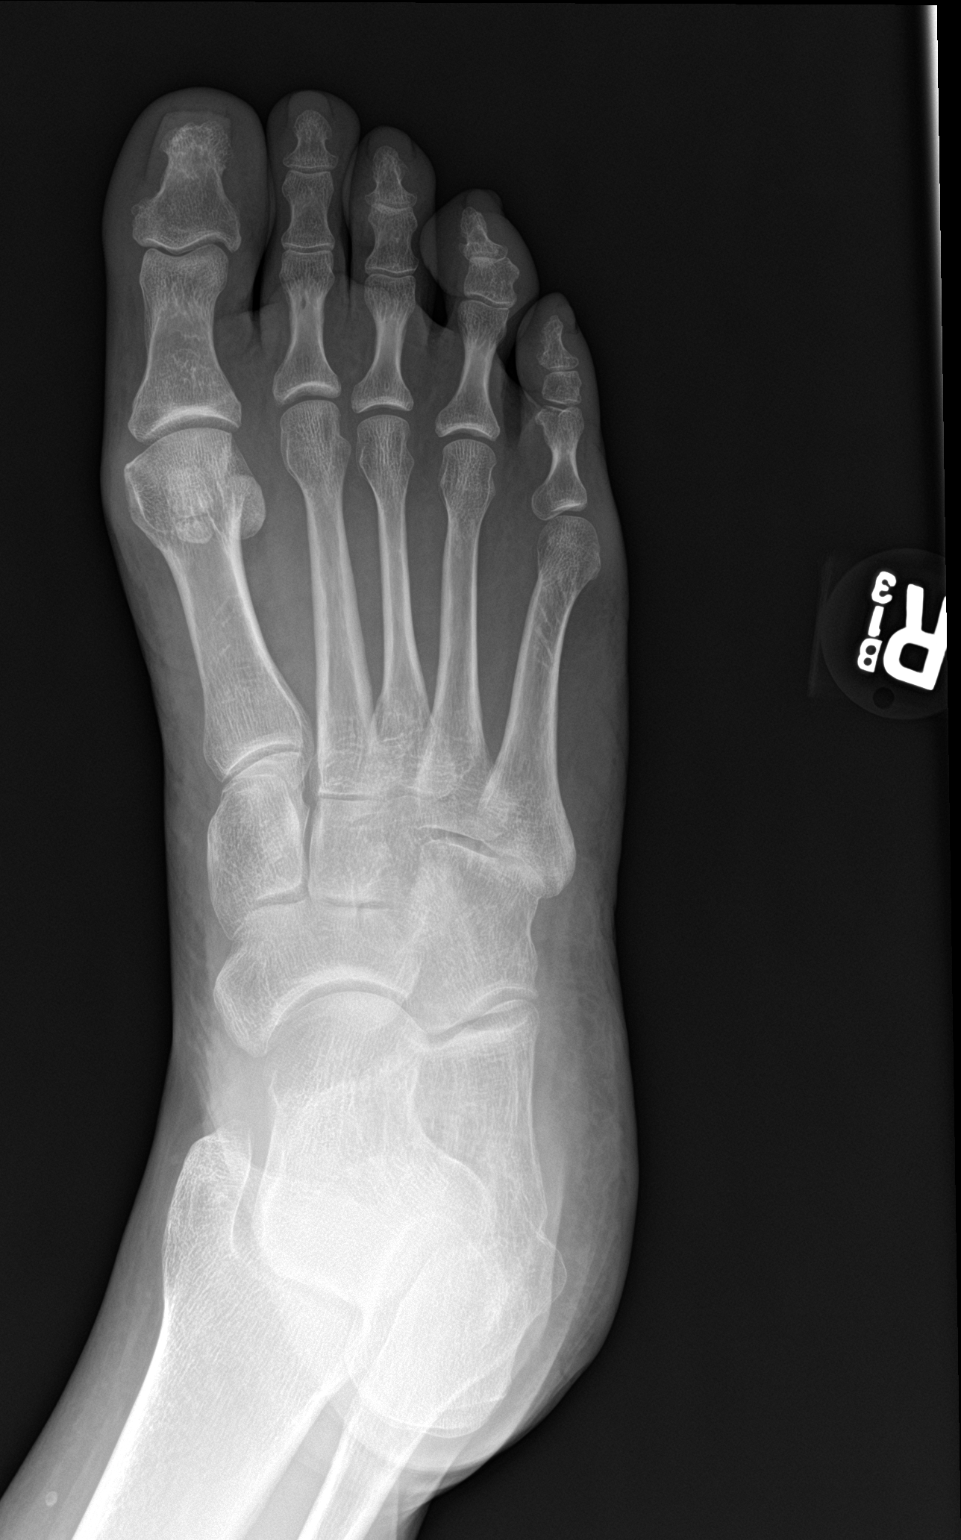

[foot obl]
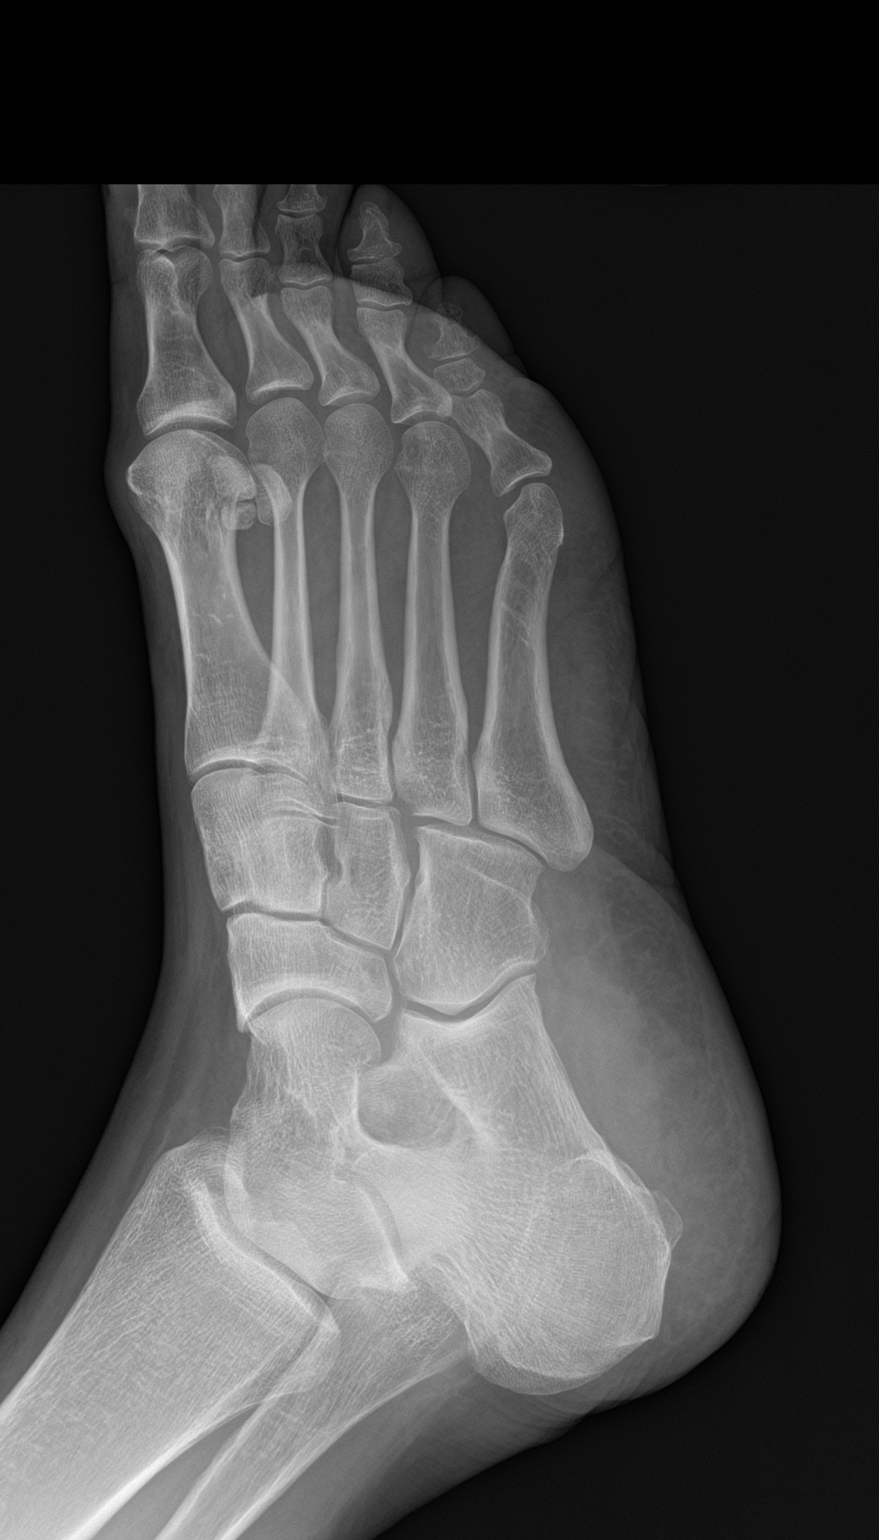

[foot lat]
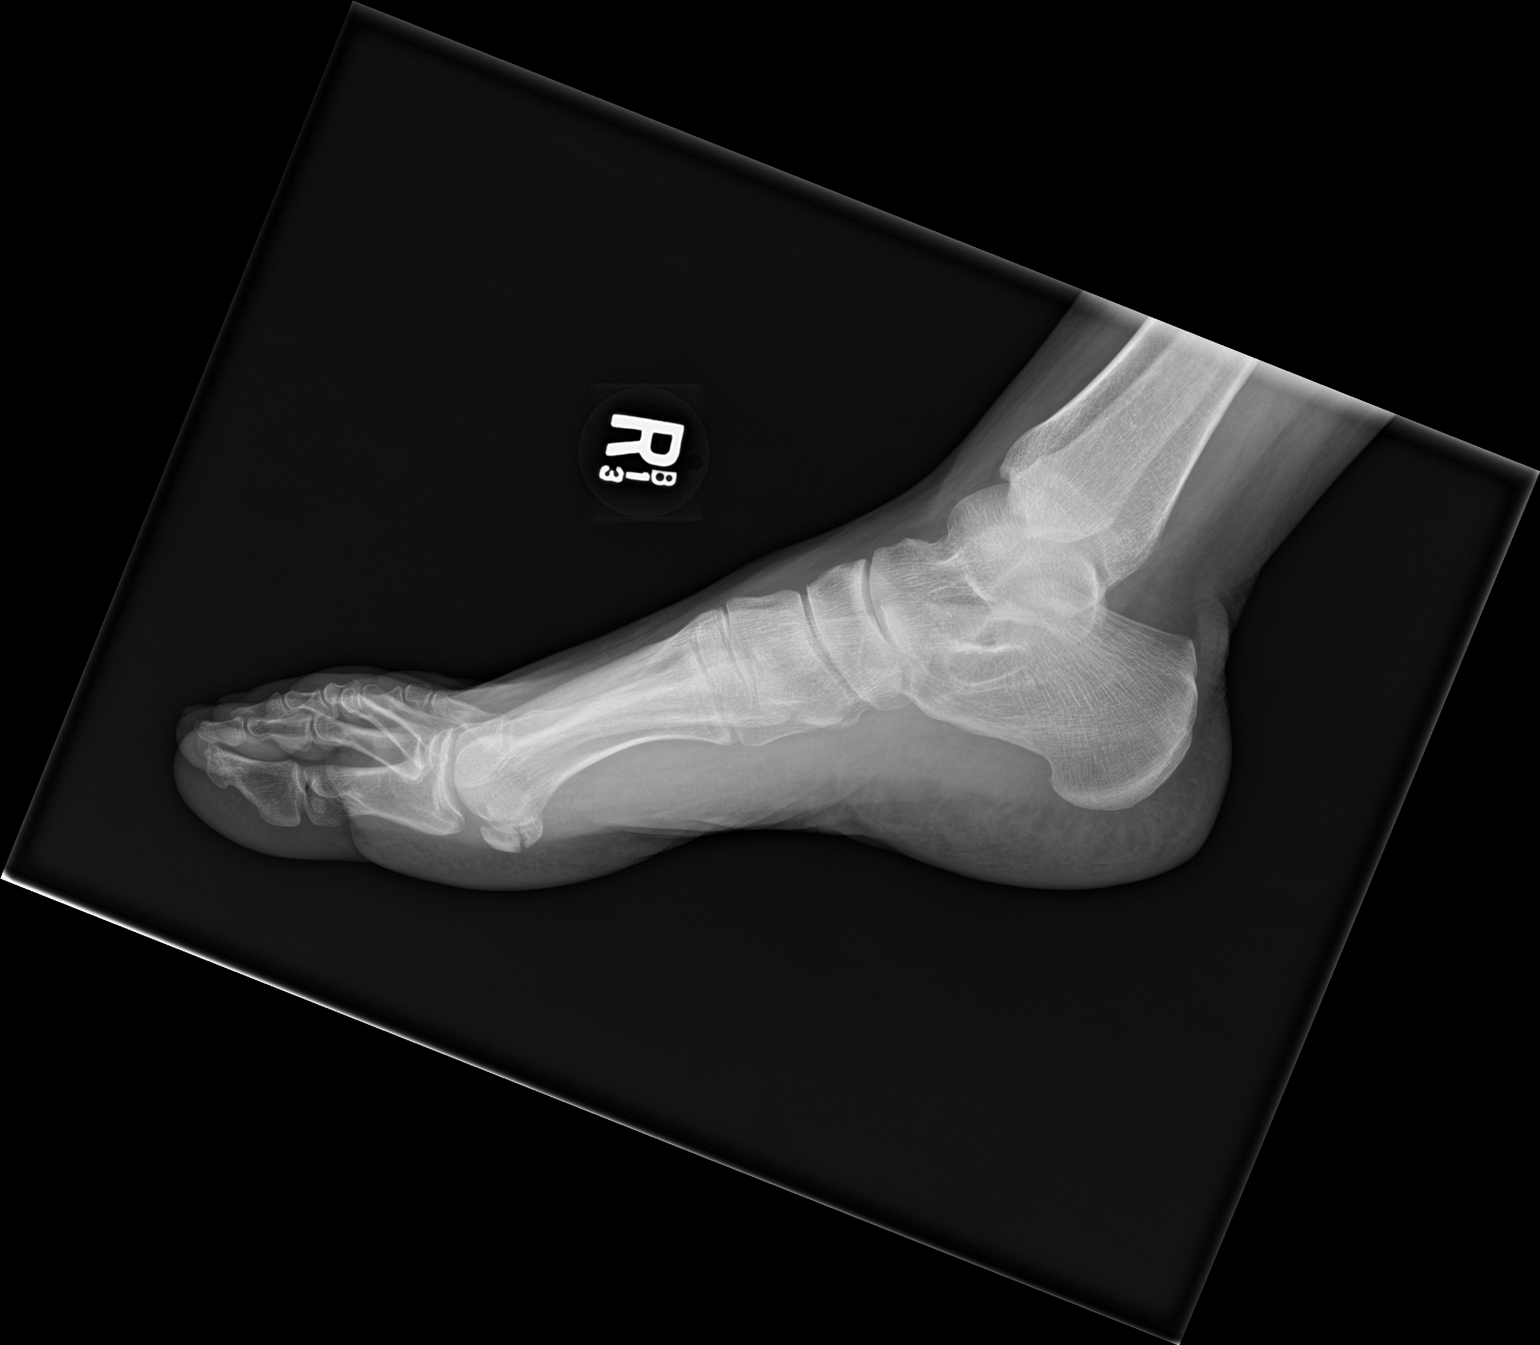

[3 of 3 positions shown; findings below may reference images not displayed]

FINDINGS: There is no evidence of fracture or dislocation. There is no
evidence of arthropathy or other focal bone abnormality. Soft
tissues are unremarkable.
IMPRESSION: Normal examination.

## 2020-07-20 IMAGING — DX DG LUMBAR SPINE COMPLETE 4+V
5 series · 5 of 5 positions shown · non-contrast
Comparison: None.

CLINICAL DATA: Low back pain with radiculopathy

EXAM:
LUMBAR SPINE - COMPLETE 4+ VIEW

[l-spine ap]
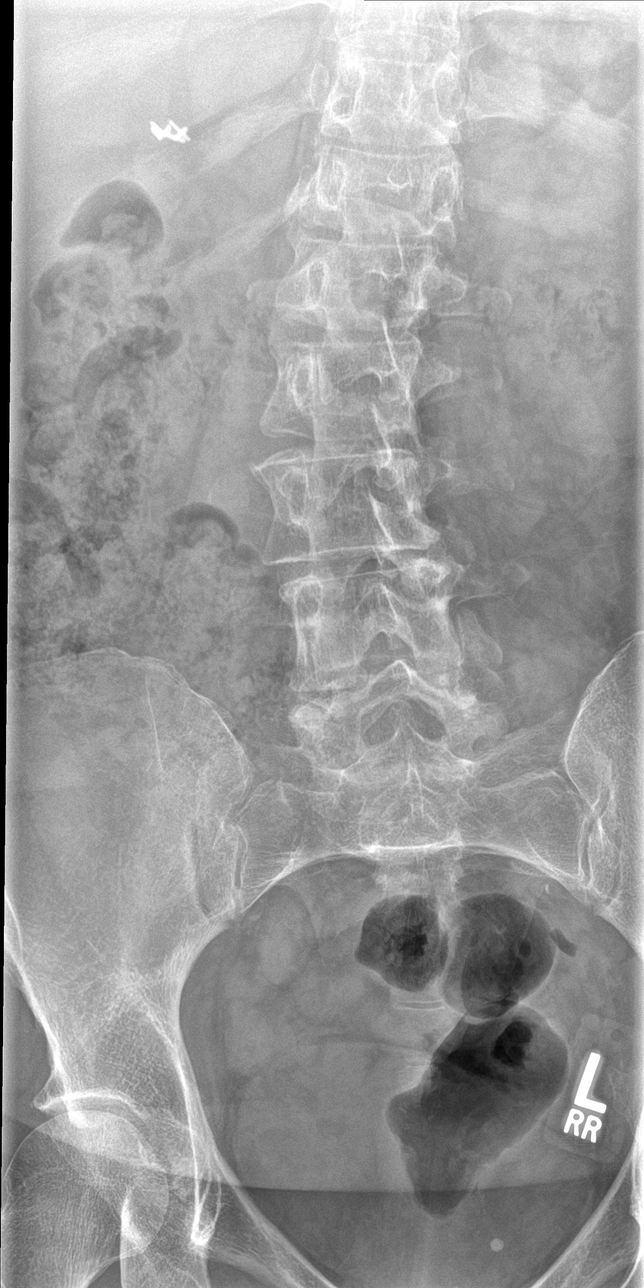

[l-spine obl (1 of 2)]
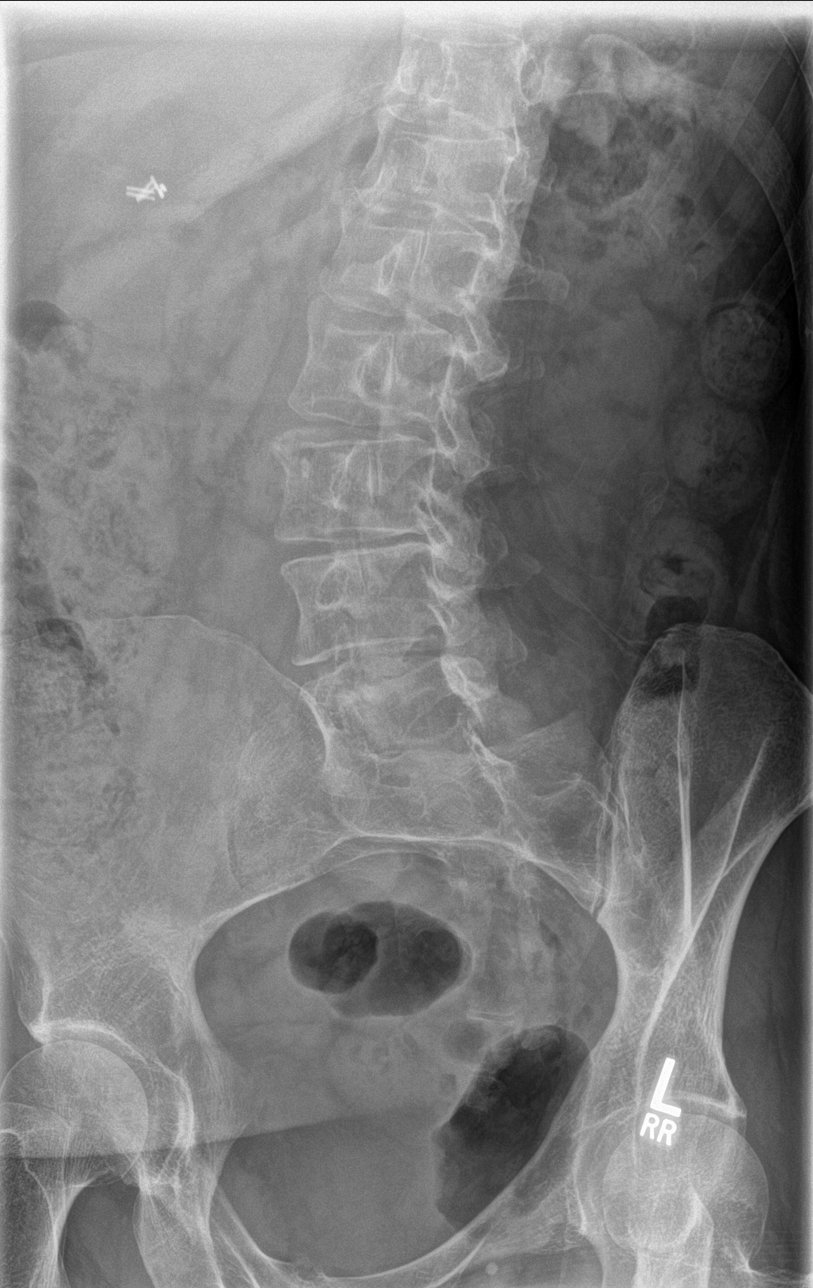

[l-spine obl (2 of 2)]
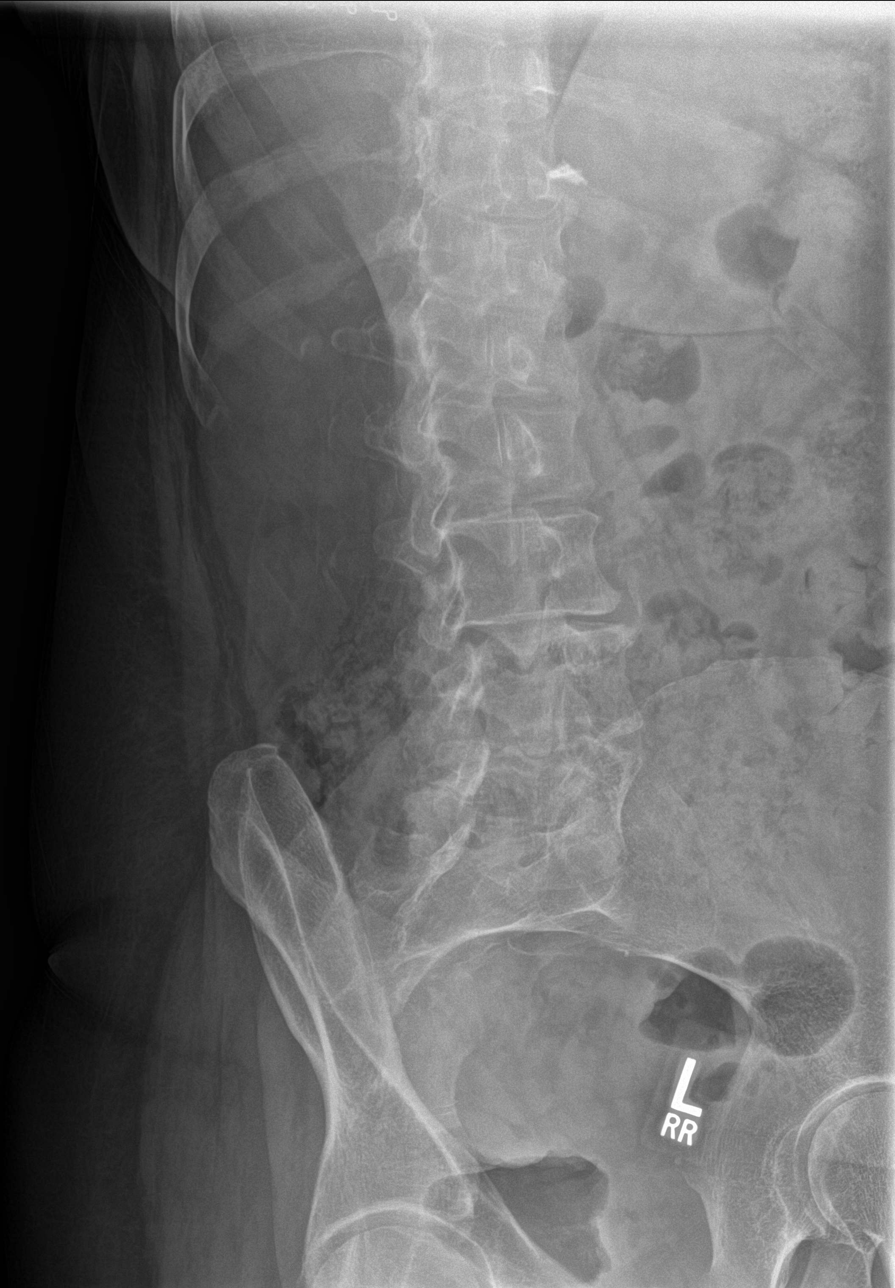

[l-spine lat]
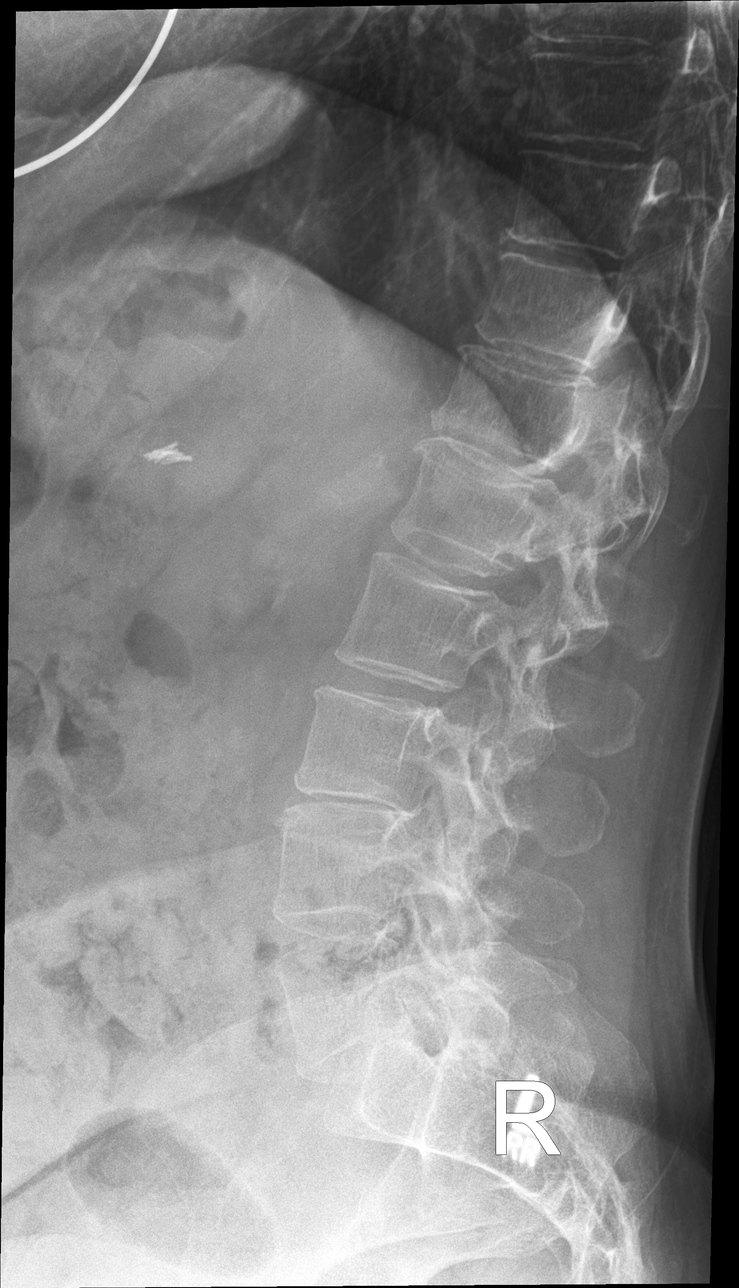

[l-spine l5-s1]
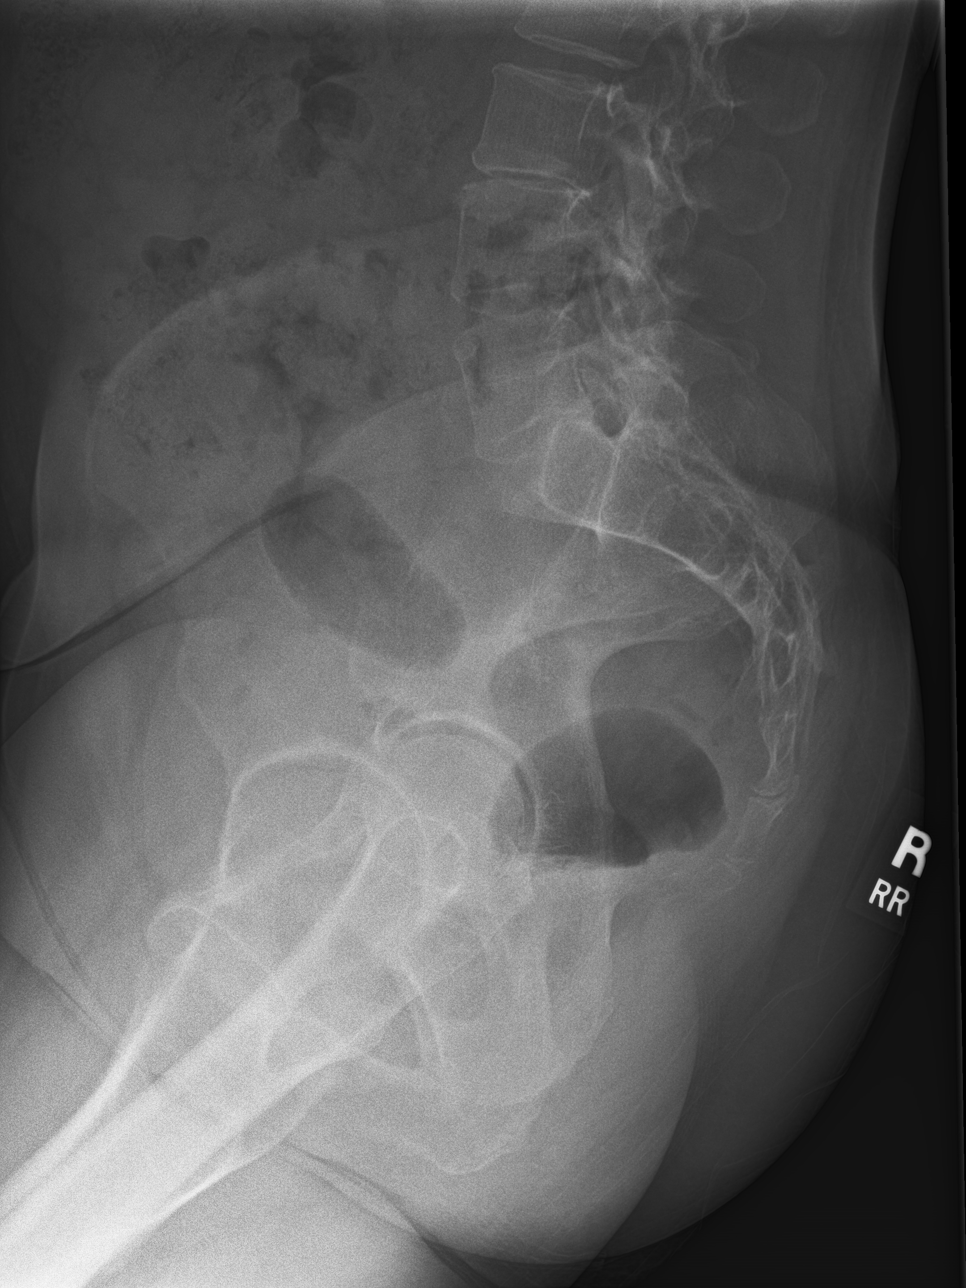

[5 of 5 positions shown; findings below may reference images not displayed]

FINDINGS: Five lumbar type vertebral bodies are well visualized. Mild
scoliosis is noted concave to the left. No pars defects are noted.
No anterolisthesis is seen. Mild osteophytic changes are noted. No
soft tissue changes are seen.
IMPRESSION: Chronic changes without acute abnormality.
